# Patient Record
Sex: Male | Born: 1985 | Race: Black or African American | Hispanic: No | Smoking: Current every day smoker
Health system: Southern US, Community
[De-identification: ages and names within clinical notes are randomized; demographics above are authoritative.]

## PROBLEM LIST (undated history)

## (undated) DIAGNOSIS — E739 Lactose intolerance, unspecified: Secondary | ICD-10-CM

## (undated) DIAGNOSIS — K259 Gastric ulcer, unspecified as acute or chronic, without hemorrhage or perforation: Secondary | ICD-10-CM

---
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
## 2006-02-08 ENCOUNTER — Emergency Department: Payer: Self-pay | Admitting: Emergency Medicine

## 2006-02-11 ENCOUNTER — Emergency Department: Payer: Self-pay | Admitting: Emergency Medicine

## 2006-07-19 ENCOUNTER — Emergency Department: Payer: Self-pay | Admitting: Emergency Medicine

## 2006-09-29 ENCOUNTER — Emergency Department: Payer: Self-pay | Admitting: Emergency Medicine

## 2007-02-02 ENCOUNTER — Emergency Department: Payer: Self-pay | Admitting: Emergency Medicine

## 2008-02-28 ENCOUNTER — Emergency Department: Payer: Self-pay | Admitting: Internal Medicine

## 2008-06-20 ENCOUNTER — Emergency Department: Payer: Self-pay | Admitting: Unknown Physician Specialty

## 2010-01-01 ENCOUNTER — Emergency Department: Payer: Self-pay | Admitting: Emergency Medicine

## 2010-03-08 ENCOUNTER — Emergency Department: Payer: Self-pay | Admitting: Internal Medicine

## 2011-10-11 ENCOUNTER — Emergency Department: Payer: Self-pay | Admitting: *Deleted

## 2011-10-13 ENCOUNTER — Emergency Department: Payer: Self-pay | Admitting: *Deleted

## 2011-10-15 ENCOUNTER — Emergency Department: Payer: Self-pay | Admitting: Emergency Medicine

## 2011-10-24 LAB — WOUND CULTURE

## 2012-08-19 ENCOUNTER — Emergency Department: Payer: Self-pay | Admitting: Emergency Medicine

## 2012-08-19 LAB — CBC
HCT: 45.1 % (ref 40.0–52.0)
HGB: 15.3 g/dL (ref 13.0–18.0)
MCV: 84 fL (ref 80–100)
Platelet: 208 10*3/uL (ref 150–440)
RBC: 5.35 10*6/uL (ref 4.40–5.90)
WBC: 6.9 10*3/uL (ref 3.8–10.6)

## 2012-08-19 LAB — URINALYSIS, COMPLETE
Bacteria: NONE SEEN
Glucose,UR: NEGATIVE mg/dL (ref 0–75)
Ketone: NEGATIVE
Leukocyte Esterase: NEGATIVE
Nitrite: NEGATIVE
Squamous Epithelial: NONE SEEN
WBC UR: 1 /HPF (ref 0–5)

## 2012-08-19 LAB — COMPREHENSIVE METABOLIC PANEL
Alkaline Phosphatase: 64 U/L (ref 50–136)
Anion Gap: 7 (ref 7–16)
BUN: 12 mg/dL (ref 7–18)
Bilirubin,Total: 0.4 mg/dL (ref 0.2–1.0)
Chloride: 102 mmol/L (ref 98–107)
Co2: 27 mmol/L (ref 21–32)
SGOT(AST): 26 U/L (ref 15–37)
SGPT (ALT): 50 U/L (ref 12–78)
Sodium: 136 mmol/L (ref 136–145)
Total Protein: 8.6 g/dL — ABNORMAL HIGH (ref 6.4–8.2)

## 2013-03-06 ENCOUNTER — Emergency Department: Payer: Self-pay | Admitting: Emergency Medicine

## 2013-05-22 ENCOUNTER — Emergency Department: Payer: Self-pay | Admitting: Emergency Medicine

## 2013-05-28 ENCOUNTER — Emergency Department: Payer: Self-pay | Admitting: Emergency Medicine

## 2013-07-03 ENCOUNTER — Emergency Department: Payer: Self-pay | Admitting: Internal Medicine

## 2013-08-05 ENCOUNTER — Emergency Department: Payer: Self-pay | Admitting: Emergency Medicine

## 2013-08-05 LAB — CBC WITH DIFFERENTIAL/PLATELET
BASOS ABS: 0.1 10*3/uL (ref 0.0–0.1)
Basophil %: 0.9 %
EOS ABS: 0.1 10*3/uL (ref 0.0–0.7)
EOS PCT: 1 %
HCT: 41.6 % (ref 40.0–52.0)
HGB: 13.2 g/dL (ref 13.0–18.0)
LYMPHS PCT: 38.2 %
Lymphocyte #: 2.2 10*3/uL (ref 1.0–3.6)
MCH: 27.1 pg (ref 26.0–34.0)
MCHC: 31.7 g/dL — ABNORMAL LOW (ref 32.0–36.0)
MCV: 86 fL (ref 80–100)
Monocyte #: 0.3 x10 3/mm (ref 0.2–1.0)
Monocyte %: 5.5 %
NEUTROS ABS: 3.1 10*3/uL (ref 1.4–6.5)
NEUTROS PCT: 54.4 %
Platelet: 177 10*3/uL (ref 150–440)
RBC: 4.86 10*6/uL (ref 4.40–5.90)
RDW: 14.6 % — AB (ref 11.5–14.5)
WBC: 5.7 10*3/uL (ref 3.8–10.6)

## 2013-08-05 LAB — COMPREHENSIVE METABOLIC PANEL
ALBUMIN: 3.6 g/dL (ref 3.4–5.0)
Alkaline Phosphatase: 50 U/L
Anion Gap: 3 — ABNORMAL LOW (ref 7–16)
BUN: 8 mg/dL (ref 7–18)
Bilirubin,Total: 0.3 mg/dL (ref 0.2–1.0)
CALCIUM: 8.6 mg/dL (ref 8.5–10.1)
CO2: 26 mmol/L (ref 21–32)
Chloride: 110 mmol/L — ABNORMAL HIGH (ref 98–107)
Creatinine: 0.89 mg/dL (ref 0.60–1.30)
EGFR (African American): >60
EGFR (Non-African Amer.): >60
Glucose: 79 mg/dL (ref 65–99)
OSMOLALITY: 275 (ref 275–301)
Potassium: 4 mmol/L (ref 3.5–5.1)
SGOT(AST): 62 U/L — ABNORMAL HIGH (ref 15–37)
SGPT (ALT): 46 U/L (ref 12–78)
Sodium: 139 mmol/L (ref 136–145)
TOTAL PROTEIN: 7 g/dL (ref 6.4–8.2)

## 2013-09-03 ENCOUNTER — Emergency Department: Payer: Self-pay | Admitting: Emergency Medicine

## 2013-09-12 ENCOUNTER — Emergency Department: Payer: Self-pay | Admitting: Emergency Medicine

## 2013-10-02 ENCOUNTER — Emergency Department: Payer: Self-pay | Admitting: Emergency Medicine

## 2013-10-02 LAB — COMPREHENSIVE METABOLIC PANEL
ALBUMIN: 4.6 g/dL (ref 3.4–5.0)
ANION GAP: 8 (ref 7–16)
Alkaline Phosphatase: 53 U/L
BUN: 16 mg/dL (ref 7–18)
Bilirubin,Total: 0.4 mg/dL (ref 0.2–1.0)
CALCIUM: 9.9 mg/dL (ref 8.5–10.1)
CHLORIDE: 106 mmol/L (ref 98–107)
Co2: 25 mmol/L (ref 21–32)
Creatinine: 1.14 mg/dL (ref 0.60–1.30)
GLUCOSE: 89 mg/dL (ref 65–99)
Osmolality: 278 (ref 275–301)
POTASSIUM: 3.6 mmol/L (ref 3.5–5.1)
SGOT(AST): 30 U/L (ref 15–37)
SGPT (ALT): 30 U/L (ref 12–78)
Sodium: 139 mmol/L (ref 136–145)
Total Protein: 8.4 g/dL — ABNORMAL HIGH (ref 6.4–8.2)

## 2013-10-02 LAB — CBC WITH DIFFERENTIAL/PLATELET
BASOS ABS: 0 10*3/uL (ref 0.0–0.1)
Basophil %: 0.2 %
EOS PCT: 1.5 %
Eosinophil #: 0.1 10*3/uL (ref 0.0–0.7)
HCT: 44.8 % (ref 40.0–52.0)
HGB: 14.5 g/dL (ref 13.0–18.0)
LYMPHS ABS: 3 10*3/uL (ref 1.0–3.6)
LYMPHS PCT: 44.6 %
MCH: 27.3 pg (ref 26.0–34.0)
MCHC: 32.3 g/dL (ref 32.0–36.0)
MCV: 85 fL (ref 80–100)
Monocyte #: 0.4 x10 3/mm (ref 0.2–1.0)
Monocyte %: 6 %
Neutrophil #: 3.2 10*3/uL (ref 1.4–6.5)
Neutrophil %: 47.7 %
Platelet: 237 10*3/uL (ref 150–440)
RBC: 5.3 10*6/uL (ref 4.40–5.90)
RDW: 14.6 % — ABNORMAL HIGH (ref 11.5–14.5)
WBC: 6.7 10*3/uL (ref 3.8–10.6)

## 2013-10-02 LAB — LIPASE, BLOOD: Lipase: 110 U/L (ref 73–393)

## 2013-11-06 ENCOUNTER — Emergency Department: Payer: Self-pay | Admitting: Emergency Medicine

## 2013-11-07 ENCOUNTER — Emergency Department: Payer: Self-pay | Admitting: Emergency Medicine

## 2013-11-11 ENCOUNTER — Emergency Department: Payer: Self-pay | Admitting: Emergency Medicine

## 2013-12-17 ENCOUNTER — Emergency Department: Payer: Self-pay | Admitting: Emergency Medicine

## 2014-03-07 ENCOUNTER — Emergency Department: Payer: Self-pay | Admitting: Student

## 2014-03-07 LAB — URINALYSIS, COMPLETE
BACTERIA: NONE SEEN
Bilirubin,UR: NEGATIVE
Blood: NEGATIVE
Glucose,UR: NEGATIVE mg/dL (ref 0–75)
Ketone: NEGATIVE
Leukocyte Esterase: NEGATIVE
NITRITE: NEGATIVE
PROTEIN: NEGATIVE
Ph: 5 (ref 4.5–8.0)
RBC,UR: NONE SEEN /HPF (ref 0–5)
SQUAMOUS EPITHELIAL: NONE SEEN
Specific Gravity: 1.023 (ref 1.003–1.030)
WBC UR: NONE SEEN /HPF (ref 0–5)

## 2014-03-07 LAB — CBC WITH DIFFERENTIAL/PLATELET
Basophil #: 0.1 10*3/uL (ref 0.0–0.1)
Basophil %: 0.5 %
Eosinophil #: 0 10*3/uL (ref 0.0–0.7)
Eosinophil %: 0.2 %
HCT: 44.8 % (ref 40.0–52.0)
HGB: 14 g/dL (ref 13.0–18.0)
LYMPHS ABS: 1.1 10*3/uL (ref 1.0–3.6)
Lymphocyte %: 9 %
MCH: 27.4 pg (ref 26.0–34.0)
MCHC: 31.2 g/dL — ABNORMAL LOW (ref 32.0–36.0)
MCV: 88 fL (ref 80–100)
MONO ABS: 0.3 x10 3/mm (ref 0.2–1.0)
Monocyte %: 2.6 %
Neutrophil #: 10.4 10*3/uL — ABNORMAL HIGH (ref 1.4–6.5)
Neutrophil %: 87.7 %
Platelet: 207 10*3/uL (ref 150–440)
RBC: 5.11 10*6/uL (ref 4.40–5.90)
RDW: 14.1 % (ref 11.5–14.5)
WBC: 11.9 10*3/uL — ABNORMAL HIGH (ref 3.8–10.6)

## 2014-03-07 LAB — COMPREHENSIVE METABOLIC PANEL
Albumin: 4 g/dL (ref 3.4–5.0)
Alkaline Phosphatase: 55 U/L
Anion Gap: 11 (ref 7–16)
BILIRUBIN TOTAL: 0.4 mg/dL (ref 0.2–1.0)
BUN: 9 mg/dL (ref 7–18)
CALCIUM: 8.7 mg/dL (ref 8.5–10.1)
CHLORIDE: 106 mmol/L (ref 98–107)
Co2: 23 mmol/L (ref 21–32)
Creatinine: 0.85 mg/dL (ref 0.60–1.30)
EGFR (African American): >60
EGFR (Non-African Amer.): >60
GLUCOSE: 95 mg/dL (ref 65–99)
OSMOLALITY: 278 (ref 275–301)
Potassium: 3.7 mmol/L (ref 3.5–5.1)
SGOT(AST): 29 U/L (ref 15–37)
SGPT (ALT): 35 U/L
Sodium: 140 mmol/L (ref 136–145)
Total Protein: 7.6 g/dL (ref 6.4–8.2)

## 2014-03-07 LAB — LIPASE, BLOOD: Lipase: 77 U/L (ref 73–393)

## 2014-03-23 ENCOUNTER — Emergency Department: Payer: Self-pay | Admitting: Emergency Medicine

## 2014-03-23 LAB — BASIC METABOLIC PANEL
ANION GAP: 5 — AB (ref 7–16)
BUN: 10 mg/dL (ref 7–18)
Calcium, Total: 8.9 mg/dL (ref 8.5–10.1)
Chloride: 107 mmol/L (ref 98–107)
Co2: 27 mmol/L (ref 21–32)
Creatinine: 1.02 mg/dL (ref 0.60–1.30)
EGFR (African American): >60
Glucose: 90 mg/dL (ref 65–99)
Osmolality: 276 (ref 275–301)
POTASSIUM: 3.7 mmol/L (ref 3.5–5.1)
SODIUM: 139 mmol/L (ref 136–145)

## 2014-03-23 LAB — CBC
HCT: 42.6 % (ref 40.0–52.0)
HGB: 14.2 g/dL (ref 13.0–18.0)
MCH: 28.8 pg (ref 26.0–34.0)
MCHC: 33.3 g/dL (ref 32.0–36.0)
MCV: 87 fL (ref 80–100)
PLATELETS: 211 10*3/uL (ref 150–440)
RBC: 4.93 10*6/uL (ref 4.40–5.90)
RDW: 14 % (ref 11.5–14.5)
WBC: 5.2 10*3/uL (ref 3.8–10.6)

## 2014-03-23 LAB — PRO B NATRIURETIC PEPTIDE: B-Type Natriuretic Peptide: 12 pg/mL (ref 0–125)

## 2014-03-23 LAB — TROPONIN I

## 2014-06-19 ENCOUNTER — Emergency Department: Payer: Self-pay | Admitting: Emergency Medicine

## 2014-06-19 LAB — CBC WITH DIFFERENTIAL/PLATELET
BASOS PCT: 2.1 %
Basophil #: 0.2 10*3/uL — ABNORMAL HIGH (ref 0.0–0.1)
EOS ABS: 0.1 10*3/uL (ref 0.0–0.7)
Eosinophil %: 1.7 %
HCT: 51.8 % (ref 40.0–52.0)
HGB: 16.9 g/dL (ref 13.0–18.0)
LYMPHS PCT: 37 %
Lymphocyte #: 2.8 10*3/uL (ref 1.0–3.6)
MCH: 27.9 pg (ref 26.0–34.0)
MCHC: 32.6 g/dL (ref 32.0–36.0)
MCV: 86 fL (ref 80–100)
MONOS PCT: 4.4 %
Monocyte #: 0.3 x10 3/mm (ref 0.2–1.0)
NEUTROS ABS: 4.2 10*3/uL (ref 1.4–6.5)
Neutrophil %: 54.8 %
Platelet: 189 10*3/uL (ref 150–440)
RBC: 6.06 10*6/uL — AB (ref 4.40–5.90)
RDW: 14 % (ref 11.5–14.5)
WBC: 7.7 10*3/uL (ref 3.8–10.6)

## 2014-06-19 LAB — BASIC METABOLIC PANEL
Anion Gap: 6 — ABNORMAL LOW (ref 7–16)
BUN: 10 mg/dL (ref 7–18)
Calcium, Total: 8.9 mg/dL (ref 8.5–10.1)
Chloride: 109 mmol/L — ABNORMAL HIGH (ref 98–107)
Co2: 26 mmol/L (ref 21–32)
Creatinine: 1.04 mg/dL (ref 0.60–1.30)
EGFR (African American): >60
GLUCOSE: 89 mg/dL (ref 65–99)
OSMOLALITY: 280 (ref 275–301)
Potassium: 3.9 mmol/L (ref 3.5–5.1)
Sodium: 141 mmol/L (ref 136–145)

## 2014-09-07 ENCOUNTER — Emergency Department
Admit: 2014-09-07 | Disposition: A | Discharge status: Home / Self Care | Payer: Self-pay | Admitting: Emergency Medicine

## 2014-09-07 LAB — CBC
HCT: 43.1 % (ref 40.0–52.0)
HGB: 13.8 g/dL (ref 13.0–18.0)
MCH: 27.7 pg (ref 26.0–34.0)
MCHC: 32.1 g/dL (ref 32.0–36.0)
MCV: 86 fL (ref 80–100)
Platelet: 200 10*3/uL (ref 150–440)
RBC: 5 10*6/uL (ref 4.40–5.90)
RDW: 13.8 % (ref 11.5–14.5)
WBC: 6.7 10*3/uL (ref 3.8–10.6)

## 2014-09-07 LAB — COMPREHENSIVE METABOLIC PANEL
ANION GAP: 5 — AB (ref 7–16)
Albumin: 3.9 g/dL
Alkaline Phosphatase: 50 U/L
BUN: 7 mg/dL
Bilirubin,Total: <0.1 mg/dL — ABNORMAL LOW
CO2: 26 mmol/L
Calcium, Total: 8.7 mg/dL — ABNORMAL LOW
Chloride: 109 mmol/L
Creatinine: 1.01 mg/dL
Glucose: 92 mg/dL
Potassium: 3.9 mmol/L
SGOT(AST): 22 U/L
SGPT (ALT): 32 U/L
SODIUM: 140 mmol/L
Total Protein: 6.3 g/dL — ABNORMAL LOW

## 2014-09-07 LAB — URINALYSIS, COMPLETE
Bacteria: NONE SEEN
Bilirubin,UR: NEGATIVE
Blood: NEGATIVE
Glucose,UR: NEGATIVE mg/dL (ref 0–75)
Ketone: NEGATIVE
LEUKOCYTE ESTERASE: NEGATIVE
NITRITE: NEGATIVE
PROTEIN: NEGATIVE
Ph: 5 (ref 4.5–8.0)
Specific Gravity: 1.016 (ref 1.003–1.030)
Squamous Epithelial: NONE SEEN

## 2014-09-07 LAB — LIPASE, BLOOD: Lipase: 29 U/L

## 2014-10-13 ENCOUNTER — Emergency Department
Admission: EM | Admit: 2014-10-13 | Discharge: 2014-10-13 | Disposition: A | Discharge status: Home / Self Care | Payer: Medicaid Other | Attending: Emergency Medicine | Admitting: Emergency Medicine

## 2014-10-13 ENCOUNTER — Emergency Department: Payer: Self-pay

## 2014-10-13 DIAGNOSIS — S62609A Fracture of unspecified phalanx of unspecified finger, initial encounter for closed fracture: Secondary | ICD-10-CM

## 2014-10-13 DIAGNOSIS — Y9289 Other specified places as the place of occurrence of the external cause: Secondary | ICD-10-CM | POA: Insufficient documentation

## 2014-10-13 DIAGNOSIS — Z720 Tobacco use: Secondary | ICD-10-CM | POA: Insufficient documentation

## 2014-10-13 DIAGNOSIS — W231XXA Caught, crushed, jammed, or pinched between stationary objects, initial encounter: Secondary | ICD-10-CM | POA: Insufficient documentation

## 2014-10-13 DIAGNOSIS — Y998 Other external cause status: Secondary | ICD-10-CM | POA: Insufficient documentation

## 2014-10-13 DIAGNOSIS — S62660A Nondisplaced fracture of distal phalanx of right index finger, initial encounter for closed fracture: Secondary | ICD-10-CM | POA: Insufficient documentation

## 2014-10-13 DIAGNOSIS — Y9389 Activity, other specified: Secondary | ICD-10-CM | POA: Insufficient documentation

## 2014-10-13 HISTORY — DX: Lactose intolerance, unspecified: E73.9

## 2014-10-13 HISTORY — DX: Gastric ulcer, unspecified as acute or chronic, without hemorrhage or perforation: K25.9

## 2014-10-13 MED ORDER — TRAMADOL HCL 50 MG PO TABS
50.0000 mg | ORAL_TABLET | Freq: Once | ORAL | Status: AC
Start: 2014-10-13 — End: 2014-10-13
  Administered 2014-10-13: 50 mg via ORAL

## 2014-10-13 MED ORDER — TRAMADOL HCL 50 MG PO TABS: 50.0000 mg | ORAL_TABLET | Freq: Three times a day (TID) | ORAL | Status: DC | PRN

## 2014-10-13 MED ORDER — TRAMADOL HCL 50 MG PO TABS
ORAL_TABLET | ORAL | Status: AC
  Administered 2014-10-13: 50 mg via ORAL
  Filled 2014-10-13: qty 1

## 2014-10-13 MED ORDER — IBUPROFEN 800 MG PO TABS: 800.0000 mg | ORAL_TABLET | Freq: Three times a day (TID) | ORAL | Status: DC | PRN

## 2014-10-13 NOTE — ED Provider Notes (Signed)
[facility] Emergency Department Provider Note ____________________________________________  Time seen: Approximately 8:30 PM  I have reviewed the triage vital signs and the nursing notes.   HISTORY  Chief Complaint Finger Injury   HPI Wesley Hart is a 29 y.o. male presents to the ER for complaints of right finger pain. Patient states that this morning on the way to work he was getting out of a car and accidentally shut his right second finger in the car door. Patient states he has had pain all day but tried to continue to work. Patient states presents now because the pain has continued. Denies other fall or injury. Denies pain to any other fingers hands or other extremities. Denies other pain.   Patient states that the pain is 7 out of 10 throbbing pain. Denies numbness or tingling. Reports swelling since this morning. Reports works for [employer] and had difficulty working today due to pain.  Past Medical History  Diagnosis Date  . Stomach ulcer   . Lactose intolerance     There are no active problems to display for this patient.   Past Surgical History  Procedure Laterality Date  . Fracture surgery      No current outpatient prescriptions on file.  Allergies Lactose intolerance (gi)  No family history on file.  Social History History  Substance Use Topics  . Smoking status: Current Every Day Smoker -- 0.50 packs/day    Types: Cigarettes  . Smokeless tobacco: Never Used  . Alcohol Use: No    Review of Systems Constitutional: No fever/chills Eyes: No visual changes. ENT: No sore throat. Cardiovascular: Denies chest pain. Respiratory: Denies shortness of breath. Gastrointestinal: No abdominal pain.  No nausea, no vomiting.  No diarrhea.  No constipation. Genitourinary: Negative for dysuria. Musculoskeletal: Negative for back pain. Positive for right second finger pain as above. Skin: Negative for rash. Neurological: Negative  for headaches, focal weakness or numbness.  10-point ROS otherwise negative.  ____________________________________________   PHYSICAL EXAM:  VITAL SIGNS: ED Triage Vitals  Enc Vitals Group     BP 10/13/14 1814 159/88 mmHg     Pulse Rate 10/13/14 1814 63     Resp 10/13/14 1814 18     Temp 10/13/14 1814 97.8 F (36.6 C)     Temp Source 10/13/14 1814 Oral     SpO2 10/13/14 1814 98 %     Weight 10/13/14 1814 230 lb (104.327 kg)     Height 10/13/14 1814 5\' 9"  (1.753 m)     Head Cir --      Peak Flow --      Pain Score 10/13/14 1815 9     Pain Loc --      Pain Edu? --      Excl. in [place]? --     Constitutional: Alert and oriented. Well appearing and in no acute distress. Head: Atraumatic. Nose: No congestion/rhinnorhea. Mouth/Throat: Mucous membranes are moist. Neck: No stridor.  No cervical spine tenderness to palpation. Cardiovascular: Normal rate, regular rhythm. Grossly normal heart sounds.  Good peripheral circulation. Respiratory: Normal respiratory effort.  No retractions. Lungs CTAB. Gastrointestinal: Soft and nontender. No distention.  Musculoskeletal: No lower extremity tenderness nor edema.  No joint effusions. Right second distal phalanx moderate tender to palpation. Superficial abrasions. Mild swelling and ecchymosis. Sensation intact. Cap refill less than 2 seconds. Pain with dip movement. Hand otherwise nontender. Distal radial pulses equal bilaterally. Neurologic:  Normal speech and language. No gross focal neurologic deficits are  appreciated. Speech is normal. No gait instability. Skin:  Skin is warm, dry and intact. No rash noted. Except superficial abrasions to right second finger. Psychiatric: Mood and affect are normal. Speech and behavior are normal.   RADIOLOGY RIGHT INDEX FINGER 2+V  COMPARISON: Hand radiographs 03/06/2013.  FINDINGS: Comminuted fracture of the distal tuft of the index finger demonstrates no definite intra-articular extension or  significant displacement. There is no dislocation. Mild soft tissue swelling is noted.  IMPRESSION: Nondisplaced fracture of the distal second phalanx.   Electronically Signed By: Richardean Sale M.D. On: 10/13/2014 19:07  ____________________________________________   PROCEDURES  Procedure(s) performed: SPLINT APPLICATION Date/Time: 1:22 PM Authorized by: Marylene Land Consent: Verbal consent obtained. Risks and benefits: risks, benefits and alternatives were discussed Consent given by: patient Splint applied by:ed  technician Location details: right second finger  Splint type: aluminum foam finger splint and 2/3 fingers buddy taped Post-procedure: The splinted body part was neurovascularly unchanged following the procedure. Patient tolerance: Patient tolerated the procedure well with no immediate complications.  ____________________________________________   INITIAL IMPRESSION / ASSESSMENT AND PLAN / ED COURSE  Pertinent labs & imaging results that were available during my care of the patient were reviewed by me and considered in my medical decision making (see chart for details).  Very well-appearing patient. no acute distress. Presents with right second finger pain after shutting in car door. X-ray positive for nondisplaced fracture of the distal second phalanx. Finger buddy taped and placed in splint. Patient to follow-up with orthopedics this week. Splint ice and elevate. Discussed importance to follow-up and discussed return parameters. Patient agreed to plan. ____________________________________________   FINAL CLINICAL IMPRESSION(S) / ED DIAGNOSES  Final diagnoses:  Finger fracture, closed, initial encounter      Marylene Land, NP 10/13/14 2135  Harvest Dark, MD 10/13/14 2356

## 2014-10-13 NOTE — ED Notes (Signed)
Pt states he slammed his right pointer finger in the car door this morning and has been having pain and swelling since

## 2014-10-13 NOTE — Discharge Instructions (Signed)
Take medication as prescribed. Keep finger and splint and keep taped together. Follow-up with orthopedics this week. See above to call to schedule. Apply ice and elevate.  Return to the ER for new or worsening concerns.  Finger Fracture (Phalangeal) A broken bone of the finger (phalangealfracture) is a common injury for athletes. A single injury (trauma) is likely to fracture multiple bones on the same or different fingers. SYMPTOMS   Severe pain, at the time of injury.  Pain, tenderness, swelling, and later bruising of the finger and then the hand.  Visible deformity, if the fracture is complete and the bone fragments separate enough to distort the normal shape.  Numbness or coldness from swelling in the finger, causing pressure on blood vessels or nerves (uncommon). CAUSES  Direct or indirect injury (trauma) to the finger.  RISK INCREASES WITH:   Contact sports (football, rugby) or other sports where injury to the hand is likely (soccer, baseball, basketball).  Sports that require hitting (boxing, martial arts).  History of bone or joint disease, such as osteoporosis, or previous bone restraint.  Poor hand strength and flexibility. PREVENTION   For contact sports, wear appropriate and properly fitted protective equipment for the hand.  Learn and use proper technique when hitting, punching, or landing after a fall.  If you had a previous finger injury or hand restraint, use tape or padding to protect the finger when playing sports where finger injury is likely. PROGNOSIS  With proper treatment and normal alignment of the bones, healing can usually be expected in 4 to 6 weeks. Sometimes, surgery is needed.  RELATED COMPLICATIONS   Fracture does not heal (nonunion).  Bone heals in wrong position (malunion).  Chronic pain, stiffness, or swelling of the hand.  Excessive bleeding, causing pressure on nerves and blood vessels.  Unstable or arthritic joint, following repeated  injury or delayed treatment.  Hindrance of normal growth in children.  Infection in skin broken over the fracture (open fracture) or at the incision or pin sites from surgery.  Shortening of injured bones.  Bony bumps or loss of shape of the fingers.  Arthritic or stiff finger joint, if the fracture reaches the joint. TREATMENT  If the bones are properly aligned, treatment involves ice and medicine to reduce pain and inflammation. Then, the finger is restrained for 4 or more weeks, to allow for healing. If the fracture is out of alignment (displaced), involves more than one bone, or involves a joint, surgery is usually advised. Surgery often involves placing removable pins, screws, and sometimes plates, to hold the bones in proper alignment. After restraint (with or without surgery), stretching and strengthening exercises are needed. Exercises may be completed at home or with a therapist. For certain sports, wearing a splint or having the finger taped during future activity is advised.  MEDICATION   If pain medicine is needed, nonsteroidal anti-inflammatory medicines (aspirin and ibuprofen), or other minor pain relievers (acetaminophen), are often advised.  Do not take pain medicine for 7 days before surgery.  Prescription pain relievers are usually prescribed only after surgery. Use only as directed and only as much as you need. COLD THERAPY   Cold treatment (icing) relieves pain and reduces inflammation. Cold treatment should be applied for 10 to 15 minutes every 2 to 3 hours, and immediately after activity that aggravates your symptoms. Use ice packs or an ice massage. SEEK MEDICAL CARE IF:   Pain, tenderness, or swelling gets worse, despite treatment.  You experience pain, numbness, or  coldness in the hand.  Blue, gray, or dark color appears in the fingernails.  Any of the following occur after surgery: fever, increased pain, swelling, redness, drainage of fluids, or bleeding in  the affected area.  New, unexplained symptoms develop. (Drugs used in treatment may produce side effects.) Document Released: 05/09/2005 Document Revised: 08/01/2011 Document Reviewed: 08/21/2008 [facility] Patient Information 2015 [place], [place]. This information is not intended to replace advice given to you by your health care provider. Make sure you discuss any questions you have with your health care provider.

## 2014-12-06 ENCOUNTER — Emergency Department
Admission: EM | Admit: 2014-12-06 | Discharge: 2014-12-06 | Disposition: A | Discharge status: Home / Self Care | Payer: Medicaid Other | Attending: Student | Admitting: Student

## 2014-12-06 ENCOUNTER — Encounter: Payer: Self-pay | Admitting: Emergency Medicine

## 2014-12-06 DIAGNOSIS — L0501 Pilonidal cyst with abscess: Secondary | ICD-10-CM | POA: Insufficient documentation

## 2014-12-06 DIAGNOSIS — Z720 Tobacco use: Secondary | ICD-10-CM | POA: Insufficient documentation

## 2014-12-06 MED ORDER — IBUPROFEN 800 MG PO TABS: 800.0000 mg | ORAL_TABLET | Freq: Three times a day (TID) | ORAL | Status: DC | PRN

## 2014-12-06 MED ORDER — SULFAMETHOXAZOLE-TRIMETHOPRIM 400-80 MG PO TABS: 1.0000 | ORAL_TABLET | Freq: Two times a day (BID) | ORAL | Status: DC

## 2014-12-06 MED ORDER — LIDOCAINE-EPINEPHRINE-TETRACAINE (LET) SOLUTION
3.0000 mL | Freq: Once | NASAL | Status: AC
  Administered 2014-12-06: 3 mL via TOPICAL
  Filled 2014-12-06: qty 3

## 2014-12-06 MED ORDER — OXYCODONE-ACETAMINOPHEN 5-325 MG PO TABS: 1.0000 | ORAL_TABLET | Freq: Three times a day (TID) | ORAL | Status: DC | PRN

## 2014-12-06 MED ORDER — OXYCODONE-ACETAMINOPHEN 5-325 MG PO TABS
1.0000 | ORAL_TABLET | Freq: Once | ORAL | Status: AC
Start: 2014-12-06 — End: 2014-12-06
  Administered 2014-12-06: 1 via ORAL
  Filled 2014-12-06: qty 1

## 2014-12-06 NOTE — Discharge Instructions (Signed)
Take medication as prescribed. Keep area clean. Do not remove packing. Apply warm compresses 4-5 times a day to help promote drainage. Change dressing as needed. Return to the ER in 3 days for packing removal and wound check. Return to the ER for new or worsening concerns.  Pilonidal Cyst A pilonidal cyst occurs when hairs get trapped (ingrown) beneath the skin in the crease between the buttocks over your sacrum (the bone under that crease). Pilonidal cysts are most common in young men with a lot of body hair. When the cyst is ruptured (breaks) or leaking, fluid from the cyst may cause burning and itching. If the cyst becomes infected, it causes a painful swelling filled with pus (abscess). The pus and trapped hairs need to be removed (often by lancing) so that the infection can heal. However, recurrence is common and an operation may be needed to remove the cyst. HOME CARE INSTRUCTIONS   If the cyst was NOT INFECTED:  Keep the area clean and dry. Bathe or shower daily. Wash the area well with a germ-killing soap. Warm tub baths may help prevent infection and help with drainage. Dry the area well with a towel.  Avoid tight clothing to keep area as moisture free as possible.  Keep area between buttocks as free of hair as possible. A depilatory may be used.  If the cyst WAS INFECTED and needed to be drained:  Your caregiver packed the wound with gauze to keep the wound open. This allows the wound to heal from the inside outwards and continue draining.  Return for a wound check in 1 day or as suggested.  If you take tub baths or showers, repack the wound with gauze following them. Sponge baths (at the sink) are a good alternative.  If an antibiotic was ordered to fight the infection, take as directed.  Only take over-the-counter or prescription medicines for pain, discomfort, or fever as directed by your caregiver.  After the drain is removed, use sitz baths for 20 minutes 4 times per day.  Clean the wound gently with mild unscented soap, pat dry, and then apply a dry dressing. SEEK MEDICAL CARE IF:   You have increased pain, swelling, redness, drainage, or bleeding from the area.  You have a fever.  You have muscles aches, dizziness, or a general ill feeling. Document Released: 05/06/2000 Document Revised: 08/01/2011 Document Reviewed: 07/04/2008 [facility] Patient Information 2015 [place], [place]. This information is not intended to replace advice given to you by your health care provider. Make sure you discuss any questions you have with your health care provider.  Abscess An abscess is an infected area that contains a collection of pus and debris.It can occur in almost any part of the body. An abscess is also known as a furuncle or boil. CAUSES  An abscess occurs when tissue gets infected. This can occur from blockage of oil or sweat glands, infection of hair follicles, or a minor injury to the skin. As the body tries to fight the infection, pus collects in the area and creates pressure under the skin. This pressure causes pain. People with weakened immune systems have difficulty fighting infections and get certain abscesses more often.  SYMPTOMS Usually an abscess develops on the skin and becomes a painful mass that is red, warm, and tender. If the abscess forms under the skin, you may feel a moveable soft area under the skin. Some abscesses break open (rupture) on their own, but most will continue to get worse without  care. The infection can spread deeper into the body and eventually into the bloodstream, causing you to feel ill.  DIAGNOSIS  Your caregiver will take your medical history and perform a physical exam. A sample of fluid may also be taken from the abscess to determine what is causing your infection. TREATMENT  Your caregiver may prescribe antibiotic medicines to fight the infection. However, taking antibiotics alone usually does not cure an abscess. Your caregiver  may need to make a small cut (incision) in the abscess to drain the pus. In some cases, gauze is packed into the abscess to reduce pain and to continue draining the area. HOME CARE INSTRUCTIONS   Only take over-the-counter or prescription medicines for pain, discomfort, or fever as directed by your caregiver.  If you were prescribed antibiotics, take them as directed. Finish them even if you start to feel better.  If gauze is used, follow your caregiver's directions for changing the gauze.  To avoid spreading the infection:  Keep your draining abscess covered with a bandage.  Wash your hands well.  Do not share personal care items, towels, or whirlpools with others.  Avoid skin contact with others.  Keep your skin and clothes clean around the abscess.  Keep all follow-up appointments as directed by your caregiver. SEEK MEDICAL CARE IF:   You have increased pain, swelling, redness, fluid drainage, or bleeding.  You have muscle aches, chills, or a general ill feeling.  You have a fever. MAKE SURE YOU:   Understand these instructions.  Will watch your condition.  Will get help right away if you are not doing well or get worse. Document Released: 02/16/2005 Document Revised: 11/08/2011 Document Reviewed: 07/22/2011 [facility] Patient Information 2015 [place], [place]. This information is not intended to replace advice given to you by your health care provider. Make sure you discuss any questions you have with your health care provider.

## 2014-12-06 NOTE — ED Notes (Signed)
Patient to ED with c/o increasing tail bone pain over the last 3 days, reports having broken it earlier in year.

## 2014-12-06 NOTE — ED Provider Notes (Signed)
[facility] Emergency Department Provider Note  ____________________________________________  Time seen: Approximately 2:19 PM  I have reviewed the triage vital signs and the nursing notes.   HISTORY  Chief Complaint Tailbone Pain   HPI Wesley Hart is a 29 y.o. male presents to the ER for pain at telephone area 3-4 days. Reports gradual onset. Patient reports that he has broken his tailbone in the past but this pain feels different. Patient denies any other pain or injury. Reports that area is swelling intermittently. Denies fever, rectal pain, dysuria or other complaints. Reports continues to eat and drink well. Reports pain is currently 7 out of 10 and worse with sitting on area.   Past Medical History  Diagnosis Date  . Stomach ulcer   . Lactose intolerance     There are no active problems to display for this patient.   Past Surgical History  Procedure Laterality Date  . Fracture surgery      Current Outpatient Rx  Name  Route  Sig  Dispense  Refill  .           Marland Kitchen             Allergies Lactose intolerance (gi) and Prednisone  History reviewed. No pertinent family history.  Social History History  Substance Use Topics  . Smoking status: Current Every Day Smoker -- 0.50 packs/day    Types: Cigarettes  . Smokeless tobacco: Never Used  . Alcohol Use: No    Review of Systems Constitutional: No fever/chills Eyes: No visual changes. ENT: No sore throat. Cardiovascular: Denies chest pain. Respiratory: Denies shortness of breath. Gastrointestinal: No abdominal pain.  No nausea, no vomiting.  No diarrhea.  No constipation. Genitourinary: Negative for dysuria. Musculoskeletal: Negative for back pain. Skin: Negative for rash. Swelling at tailbone.  Neurological: Negative for headaches, focal weakness or numbness.  10-point ROS otherwise negative.  ____________________________________________   PHYSICAL EXAM:  VITAL SIGNS: ED  Triage Vitals  Enc Vitals Group     BP 12/06/14 1225 123/80 mmHg     Pulse Rate 12/06/14 1225 64     Resp 12/06/14 1225 20     Temp 12/06/14 1225 97.7 F (36.5 C)     Temp Source 12/06/14 1225 Oral     SpO2 12/06/14 1225 97 %     Weight 12/06/14 1225 235 lb (106.595 kg)     Height 12/06/14 1225 5\' 10"  (1.778 m)     Head Cir --      Peak Flow --      Pain Score 12/06/14 1226 10     Pain Loc --      Pain Edu? --      Excl. in [place]? --     Constitutional: Alert and oriented. Well appearing and in no acute distress. Eyes: Conjunctivae are normal. PERRL. EOMI. Head: Atraumatic. Nose: No congestion/rhinnorhea. Mouth/Throat: Mucous membranes are moist.  Oropharynx non-erythematous. Neck: No stridor.  No cervical spine tenderness to palpation. Hematological/Lymphatic/Immunilogical: No cervical lymphadenopathy. Cardiovascular: Normal rate, regular rhythm. Grossly normal heart sounds.  Good peripheral circulation. Respiratory: Normal respiratory effort.  No retractions. Lungs CTAB. Gastrointestinal: Soft and nontender. No distention. No abdominal bruits. No CVA tenderness. Musculoskeletal: No lower extremity tenderness nor edema.  No joint effusions. Neurologic:  Normal speech and language. No gross focal neurologic deficits are appreciated. No gait instability. Skin:  Skin is warm, dry and intact. No rash noted. Upper buttocks right sided pilonidal abscess, with mild swelling, mod induration and  mod TTP. No drainage.  Psychiatric: Mood and affect are normal. Speech and behavior are normal.  ____________________________________________   LABS (all labs ordered are listed, but only abnormal results are displayed)  Labs Reviewed - No data to display ____________________________________________  PROCEDURES  Procedure(s) performed:  INCISION AND DRAINAGE Performed by: Marylene Land Consent: Verbal consent obtained. Risks and benefits: risks, benefits and alternatives were  discussed Type: abscess  Body area: upper buttocks right sided  Anesthesia: local infiltration  Incision was made with a scalpel.  Local anesthetic: topical LET (patient reports that he is scared of needles and request topical anesthesia)  Complexity: complex Blunt dissection to break up loculations  Drainage: purulent  Drainage amount: large  Packing material: 1/4 in iodoform gauze  Patient tolerance: Patient tolerated the procedure well with no immediate complications.   ____________________________________________   INITIAL IMPRESSION / ASSESSMENT AND PLAN / ED COURSE  Pertinent labs & imaging results that were available during my care of the patient were reviewed by me and considered in my medical decision making (see chart for details).  Very well-appearing patient. Presents the ER for the complaints of swelling to tailbone area 3-4 days. Patient since with pilonidal abscess. Abscess drained in ER with a large amount purulent returned packed with one fourth cause. Patient return in ER in ER for 2-3 days for wound check and packing removal. Discuss follow-up and return parameters. Patient agreed to plan. ____________________________________________   FINAL CLINICAL IMPRESSION(S) / ED DIAGNOSES  Final diagnoses:  Pilonidal abscess      Marylene Land, NP 12/06/14 Melody [place] Gayle, MD 12/06/14 1555

## 2015-01-20 ENCOUNTER — Encounter: Payer: Self-pay | Admitting: Emergency Medicine

## 2015-01-20 ENCOUNTER — Emergency Department
Admission: EM | Admit: 2015-01-20 | Discharge: 2015-01-20 | Disposition: A | Discharge status: Home / Self Care | Payer: Medicaid Other | Attending: Emergency Medicine | Admitting: Emergency Medicine

## 2015-01-20 DIAGNOSIS — L0501 Pilonidal cyst with abscess: Secondary | ICD-10-CM | POA: Insufficient documentation

## 2015-01-20 DIAGNOSIS — Z720 Tobacco use: Secondary | ICD-10-CM | POA: Insufficient documentation

## 2015-01-20 MED ORDER — OXYCODONE-ACETAMINOPHEN 5-325 MG PO TABS: 1.0000 | ORAL_TABLET | ORAL | Status: DC | PRN

## 2015-01-20 MED ORDER — SULFAMETHOXAZOLE-TRIMETHOPRIM 800-160 MG PO TABS: 2.0000 | ORAL_TABLET | Freq: Two times a day (BID) | ORAL | Status: AC

## 2015-01-20 MED ORDER — LIDOCAINE HCL (PF) 1 % IJ SOLN
5.0000 mL | Freq: Once | INTRAMUSCULAR | Status: AC
  Administered 2015-01-20: 5 mL via INTRADERMAL
  Filled 2015-01-20: qty 5

## 2015-01-20 MED ORDER — OXYCODONE-ACETAMINOPHEN 5-325 MG PO TABS
2.0000 | ORAL_TABLET | Freq: Once | ORAL | Status: AC
Start: 2015-01-20 — End: 2015-01-20
  Administered 2015-01-20: 2 via ORAL
  Filled 2015-01-20: qty 2

## 2015-01-20 MED ORDER — SULFAMETHOXAZOLE-TRIMETHOPRIM 800-160 MG PO TABS
2.0000 | ORAL_TABLET | Freq: Once | ORAL | Status: AC
  Administered 2015-01-20: 2 via ORAL
  Filled 2015-01-20: qty 2

## 2015-01-20 NOTE — ED Notes (Signed)
Patient ambulatory to triage with steady gait, without difficulty or distress noted; pt reports abscess to coccyx area; st here few months ago for same

## 2015-01-20 NOTE — ED Provider Notes (Signed)
[facility] Emergency Department Provider Note  ____________________________________________  Time seen: 6:00 AM  I have reviewed the triage vital signs and the nursing notes.   HISTORY  Chief Complaint Abscess     HPI Wesley Hart is a 29 y.o. male presents with abscess in his coccyx time one day. Patient states he was seen for the same here few months ago which required an I&D. Patient states she's had 2 other episodes of the same. Patient denies any fever no nausea or vomiting     Past Medical History  Diagnosis Date  . Stomach ulcer   . Lactose intolerance     There are no active problems to display for this patient.   Past Surgical History  Procedure Laterality Date  . Fracture surgery      Current Outpatient Rx  Name  Route  Sig  Dispense  Refill  . ibuprofen (ADVIL,MOTRIN) 800 MG tablet   Oral   Take 1 tablet (800 mg total) by mouth every 8 (eight) hours as needed for mild pain or moderate pain.   15 tablet   0   . oxyCODONE-acetaminophen (ROXICET) 5-325 MG per tablet   Oral   Take 1 tablet by mouth every 8 (eight) hours as needed for moderate pain or severe pain (Do not drive or operate heavy machinery while taking as can cause drowsiness.).   9 tablet   0   . sulfamethoxazole-trimethoprim (BACTRIM) 400-80 MG per tablet   Oral   Take 1 tablet by mouth 2 (two) times daily.   20 tablet   0   . traMADol (ULTRAM) 50 MG tablet   Oral   Take 1 tablet (50 mg total) by mouth every 8 (eight) hours as needed (Do not drive or operate machinery while taking as can cause drowsiness.).   12 tablet   0     Allergies Lactose intolerance (gi) and Prednisone  No family history on file.  Social History Social History  Substance Use Topics  . Smoking status: Current Every Day Smoker -- 0.50 packs/day    Types: Cigarettes  . Smokeless tobacco: Never Used  . Alcohol Use: No    Review of Systems  Constitutional: Negative for  fever. Eyes: Negative for visual changes. ENT: Negative for sore throat. Cardiovascular: Negative for chest pain. Respiratory: Negative for shortness of breath. Gastrointestinal: Negative for abdominal pain, vomiting and diarrhea. Genitourinary: Negative for dysuria. Musculoskeletal: Negative for back pain. Skin: Negative for rash. Positive for abscess Neurological: Negative for headaches, focal weakness or numbness.   10-point ROS otherwise negative.  ____________________________________________   PHYSICAL EXAM:  VITAL SIGNS: ED Triage Vitals  Enc Vitals Group     BP 01/20/15 0235 138/84 mmHg     Pulse Rate 01/20/15 0235 83     Resp 01/20/15 0235 20     Temp 01/20/15 0235 98.2 F (36.8 C)     Temp Source 01/20/15 0235 Oral     SpO2 01/20/15 0235 98 %     Weight 01/20/15 0235 240 lb (108.863 kg)     Height 01/20/15 0235 5\' 10"  (1.778 m)     Head Cir --      Peak Flow --      Pain Score 01/20/15 0233 10     Pain Loc --      Pain Edu? --      Excl. in [place]? --      Constitutional: Alert and oriented. Well appearing and in no distress.  Eyes: Conjunctivae are normal. PERRL. Normal extraocular movements. ENT   Head: Normocephalic and atraumatic.   Nose: No congestion/rhinnorhea.   Mouth/Throat: Mucous membranes are moist.   Neck: No stridor. Hematological/Lymphatic/Immunilogical: No cervical lymphadenopathy. Cardiovascular: Normal rate, regular rhythm. Normal and symmetric distal pulses are present in all extremities. No murmurs, rubs, or gallops. Respiratory: Normal respiratory effort without tachypnea nor retractions. Breath sounds are clear and equal bilaterally. No wheezes/rales/rhonchi. Gastrointestinal: Soft and nontender. No distention. There is no CVA tenderness. Genitourinary: deferred Musculoskeletal: Nontender with normal range of motion in all extremities. No joint effusions.  No lower extremity tenderness nor edema. Neurologic:  Normal speech and  language. No gross focal neurologic deficits are appreciated. Speech is normal.  Skin:  Tender flocculent area and noted pilonidal region. Psychiatric: Mood and affect are normal. Speech and behavior are normal. Patient exhibits appropriate insight and judgment.    PROCEDURES  Procedure(s) performed: INCISION AND DRAINAGE Performed by: Marjean Donna N Consent: Verbal consent obtained. Risks and benefits: risks, benefits and alternatives were discussed Type: abscess  Body area: pilonidal area   Anesthesia: local infiltration  Incision was made with a scalpel.  Local anesthetic: lidocaine 1%   Anesthetic total: 64ml  Complexity: complex Blunt dissection to break up loculations  Drainage: purulent  Drainage amount: 5 cc of pus   Packing material: 1/4 in iodoform gauze  Patient tolerance: Patient tolerated the procedure well with no immediate complications.      ____________________________________________   INITIAL IMPRESSION / ASSESSMENT AND PLAN / ED COURSE  Pilonidal abscess I&D with 5 cc of purulent drainage. Patient given Percocet for pain and Bactrim will be prescribed the same for home.ging results that were available during my care of the patient were reviewed by me and considered in my medical decision making    ____________________________________________   FINAL CLINICAL IMPRESSION(S) / ED DIAGNOSES  Final diagnoses:  Pilonidal abscess      Gregor Hams, MD 01/20/15 [id number]

## 2015-01-20 NOTE — Discharge Instructions (Signed)
°

## 2015-04-03 ENCOUNTER — Encounter: Payer: Self-pay | Admitting: Emergency Medicine

## 2015-04-03 ENCOUNTER — Emergency Department
Admission: EM | Admit: 2015-04-03 | Discharge: 2015-04-03 | Disposition: A | Discharge status: Home / Self Care | Payer: Medicaid Other | Attending: Emergency Medicine | Admitting: Emergency Medicine

## 2015-04-03 DIAGNOSIS — L0501 Pilonidal cyst with abscess: Secondary | ICD-10-CM | POA: Insufficient documentation

## 2015-04-03 DIAGNOSIS — Z720 Tobacco use: Secondary | ICD-10-CM | POA: Insufficient documentation

## 2015-04-03 MED ORDER — SULFAMETHOXAZOLE-TRIMETHOPRIM 800-160 MG PO TABS
1.0000 | ORAL_TABLET | Freq: Once | ORAL | Status: AC
  Administered 2015-04-03: 1 via ORAL

## 2015-04-03 MED ORDER — LIDOCAINE HCL (PF) 1 % IJ SOLN
INTRAMUSCULAR | Status: AC
  Administered 2015-04-03: 5 mL via INTRADERMAL
  Filled 2015-04-03: qty 5

## 2015-04-03 MED ORDER — SULFAMETHOXAZOLE-TRIMETHOPRIM 800-160 MG PO TABS: 1.0000 | ORAL_TABLET | Freq: Two times a day (BID) | ORAL | Status: DC

## 2015-04-03 MED ORDER — OXYCODONE-ACETAMINOPHEN 5-325 MG PO TABS
1.0000 | ORAL_TABLET | Freq: Once | ORAL | Status: AC
  Administered 2015-04-03: 1 via ORAL
  Filled 2015-04-03: qty 1

## 2015-04-03 MED ORDER — LIDOCAINE HCL (PF) 1 % IJ SOLN
5.0000 mL | Freq: Once | INTRAMUSCULAR | Status: AC
  Administered 2015-04-03: 5 mL via INTRADERMAL

## 2015-04-03 MED ORDER — SULFAMETHOXAZOLE-TRIMETHOPRIM 800-160 MG PO TABS
ORAL_TABLET | ORAL | Status: AC
  Administered 2015-04-03: 1 via ORAL
  Filled 2015-04-03: qty 1

## 2015-04-03 MED ORDER — OXYCODONE-ACETAMINOPHEN 5-325 MG PO TABS: 1.0000 | ORAL_TABLET | ORAL | Status: DC | PRN

## 2015-04-03 NOTE — ED Provider Notes (Signed)
[facility] Emergency Department Provider Note  ____________________________________________  Time seen: 1:30 AM  I have reviewed the triage vital signs and the nursing notes.   HISTORY  Chief Complaint Abscess     HPI Wesley Hart is a 29 y.o. male presents with abscess to sacral region which she noted yesterday. Of note patient has a history of pilonidal abscess that has required incision and drainage in the past with packing. Patient denies any fever no nausea or vomiting current pain score is 10 out of 10.     Past Medical History  Diagnosis Date  . Stomach ulcer   . Lactose intolerance     There are no active problems to display for this patient.   Past Surgical History  Procedure Laterality Date  . Fracture surgery      Current Outpatient Rx  Name  Route  Sig  Dispense  Refill  . ibuprofen (ADVIL,MOTRIN) 800 MG tablet   Oral   Take 1 tablet (800 mg total) by mouth every 8 (eight) hours as needed for mild pain or moderate pain.   15 tablet   0   . oxyCODONE-acetaminophen (PERCOCET/ROXICET) 5-325 MG per tablet   Oral   Take 1 tablet by mouth every 4 (four) hours as needed for severe pain.   12 tablet   0   . oxyCODONE-acetaminophen (ROXICET) 5-325 MG per tablet   Oral   Take 1 tablet by mouth every 8 (eight) hours as needed for moderate pain or severe pain (Do not drive or operate heavy machinery while taking as can cause drowsiness.).   9 tablet   0   . sulfamethoxazole-trimethoprim (BACTRIM) 400-80 MG per tablet   Oral   Take 1 tablet by mouth 2 (two) times daily.   20 tablet   0   . traMADol (ULTRAM) 50 MG tablet   Oral   Take 1 tablet (50 mg total) by mouth every 8 (eight) hours as needed (Do not drive or operate machinery while taking as can cause drowsiness.).   12 tablet   0     Allergies Lactose intolerance (gi) and Prednisone  No family history on file.  Social History Social History  Substance Use  Topics  . Smoking status: Current Every Day Smoker -- 0.50 packs/day    Types: Cigarettes  . Smokeless tobacco: Never Used  . Alcohol Use: No    Review of Systems  Constitutional: Negative for fever. Eyes: Negative for visual changes. ENT: Negative for sore throat. Cardiovascular: Negative for chest pain. Respiratory: Negative for shortness of breath. Gastrointestinal: Negative for abdominal pain, vomiting and diarrhea. Genitourinary: Negative for dysuria. Musculoskeletal: Negative for back pain. Skin: Positive for sacral abscess Neurological: Negative for headaches, focal weakness or numbness.  10-point ROS otherwise negative.  ____________________________________________   PHYSICAL EXAM:  VITAL SIGNS: ED Triage Vitals  Enc Vitals Group     BP 04/03/15 0053 141/80 mmHg     Pulse Rate 04/03/15 0053 88     Resp 04/03/15 0053 18     Temp 04/03/15 0053 98.4 F (36.9 C)     Temp Source 04/03/15 0053 Oral     SpO2 04/03/15 0053 96 %     Weight 04/03/15 0053 240 lb (108.863 kg)     Height 04/03/15 0053 5\' 11"  (1.803 m)     Head Cir --      Peak Flow --      Pain Score 04/03/15 0053 9  Pain Loc --      Pain Edu? --      Excl. in [place]? --      Constitutional: Alert and oriented. Well appearing and in no distress. Eyes: Conjunctivae are normal. PERRL. Normal extraocular movements. ENT   Head: Normocephalic and atraumatic.   Nose: No congestion/rhinnorhea.   Mouth/Throat: Mucous membranes are moist.   Neck: No stridor. Hematological/Lymphatic/Immunilogical: No cervical lymphadenopathy. Cardiovascular: Normal rate, regular rhythm. Normal and symmetric distal pulses are present in all extremities. No murmurs, rubs, or gallops. Respiratory: Normal respiratory effort without tachypnea nor retractions. Breath sounds are clear and equal bilaterally. No wheezes/rales/rhonchi. Gastrointestinal: Soft and nontender. No distention. There is no CVA  tenderness. Genitourinary: deferred Musculoskeletal: Nontender with normal range of motion in all extremities. No joint effusions.  No lower extremity tenderness nor edema. Neurologic:  Normal speech and language. No gross focal neurologic deficits are appreciated. Speech is normal.  Skin:  Skin is warm, dry and intact. No rash noted. Psychiatric: Mood and affect are normal. Speech and behavior are normal. Patient exhibits appropriate insight and judgment.  Procedure note:INCISION AND DRAINAGE Performed by: Marjean Donna N Consent: Verbal consent obtained. Risks and benefits: risks, benefits and alternatives were discussed Type: abscess  Body area:   Anesthesia: local infiltration  Incision was made with a scalpel.  Local anesthetic: lidocaine 1%   Anesthetic total: 5 ml  Complexity: complex Blunt dissection to break up loculations  Drainage: purulent  Drainage amount: 7 mL     Patient tolerance: Patient tolerated the procedure well with no immediate complications.      INITIAL IMPRESSION / ASSESSMENT AND PLAN / ED COURSE  Pertinent labs & imaging results that were available during my care of the patient were reviewed by me and considered in my medical decision making (see chart for details).  Patient received Bactrim DS one tab as well as Percocet for pain in the emergency department was prescribed the same for home.  ____________________________________________   FINAL CLINICAL IMPRESSION(S) / ED DIAGNOSES  Final diagnoses:  Pilonidal abscess      Gregor Hams, MD 04/07/15 2256

## 2015-04-03 NOTE — ED Notes (Signed)
Abscess to sacral area since yest, hx of the same.

## 2015-04-03 NOTE — Discharge Instructions (Signed)
Abscess An abscess is an infected area that contains a collection of pus and debris.It can occur in almost any part of the body. An abscess is also known as a furuncle or boil. CAUSES  An abscess occurs when tissue gets infected. This can occur from blockage of oil or sweat glands, infection of hair follicles, or a minor injury to the skin. As the body tries to fight the infection, pus collects in the area and creates pressure under the skin. This pressure causes pain. People with weakened immune systems have difficulty fighting infections and get certain abscesses more often.  SYMPTOMS Usually an abscess develops on the skin and becomes a painful mass that is red, warm, and tender. If the abscess forms under the skin, you may feel a moveable soft area under the skin. Some abscesses break open (rupture) on their own, but most will continue to get worse without care. The infection can spread deeper into the body and eventually into the bloodstream, causing you to feel ill.  DIAGNOSIS  Your caregiver will take your medical history and perform a physical exam. A sample of fluid may also be taken from the abscess to determine what is causing your infection. TREATMENT  Your caregiver may prescribe antibiotic medicines to fight the infection. However, taking antibiotics alone usually does not cure an abscess. Your caregiver may need to make a small cut (incision) in the abscess to drain the pus. In some cases, gauze is packed into the abscess to reduce pain and to continue draining the area. HOME CARE INSTRUCTIONS   Only take over-the-counter or prescription medicines for pain, discomfort, or fever as directed by your caregiver.  If you were prescribed antibiotics, take them as directed. Finish them even if you start to feel better.  If gauze is used, follow your caregiver's directions for changing the gauze.  To avoid spreading the infection:  Keep your draining abscess covered with a  bandage.  Wash your hands well.  Do not share personal care items, towels, or whirlpools with others.  Avoid skin contact with others.  Keep your skin and clothes clean around the abscess.  Keep all follow-up appointments as directed by your caregiver. SEEK MEDICAL CARE IF:   You have increased pain, swelling, redness, fluid drainage, or bleeding.  You have muscle aches, chills, or a general ill feeling.  You have a fever. MAKE SURE YOU:   Understand these instructions.  Will watch your condition.  Will get help right away if you are not doing well or get worse.   This information is not intended to replace advice given to you by your health care provider. Make sure you discuss any questions you have with your health care provider.   Document Released: 02/16/2005 Document Revised: 11/08/2011 Document Reviewed: 07/22/2011 Elsevier Interactive Patient Education 2016 [employer]  Pilonidal Cyst A pilonidal cyst is a fluid-filled sac. It forms beneath the skin near your tailbone, at the top of the crease of your buttocks. A pilonidal cyst that is not large or infected may not cause symptoms or problems. If the cyst becomes irritated or infected, it may fill with pus. This causes pain and swelling (pilonidal abscess). An infected cyst may need to be treated with medicine, drained, or removed. CAUSES The cause of a pilonidal cyst is not known. One cause may be a hair that grows into your skin (ingrown hair). RISK FACTORS Pilonidal cysts are more common in boys and men. Risk factors include:  Having lots of  hair near the crease of the buttocks.  Being overweight.  Having a pilonidal dimple.  Wearing tight clothing.  Not bathing or showering frequently.  Sitting for long periods of time. SIGNS AND SYMPTOMS Signs and symptoms of a pilonidal cyst may include:  Redness.  Pain and tenderness.  Warmth.  Swelling.  Pus.  Fever. DIAGNOSIS Your health care provider  may diagnose a pilonidal cyst based on your symptoms and a physical exam. The health care provider may do a blood test to check for infection. If your cyst is draining pus, your health care provider may take a sample of the drainage to be tested at a laboratory. TREATMENT Surgery is the usual treatment for an infected pilonidal cyst. You may also have to take medicines before surgery. The type of surgery you have depends on the size and severity of the infected cyst. The different kinds of surgery include:  Incision and drainage. This is a procedure to open and drain the cyst.  Marsupialization. In this procedure, a large cyst or abscess may be opened and kept open by stitching the edges of the skin to the cyst walls.  Cyst removal. This procedure involves opening the skin and removing all or part of the cyst. HOME CARE INSTRUCTIONS  Follow all of your surgeon's instructions carefully if you had surgery.  Take medicines only as directed by your health care provider.  If you were prescribed an antibiotic medicine, finish it all even if you start to feel better.  Keep the area around your pilonidal cyst clean and dry.  Clean the area as directed by your health care provider. Pat the area dry with a clean towel. Do not rub it as this may cause bleeding.  Remove hair from the area around the cyst as directed by your health care provider.  Do not wear tight clothing or sit in one place for long periods of time.  There are many different ways to close and cover an incision, including stitches, skin glue, and adhesive strips. Follow your health care provider's instructions on:  Incision care.  Bandage (dressing) changes and removal.  Incision closure removal. SEEK MEDICAL CARE IF:   You have drainage, redness, swelling, or pain at the site of the cyst.  You have a fever.   This information is not intended to replace advice given to you by your health care provider. Make sure you  discuss any questions you have with your health care provider.   Document Released: 05/06/2000 Document Revised: 05/30/2014 Document Reviewed: 09/26/2013 Elsevier Interactive Patient Education [employer].

## 2015-04-14 ENCOUNTER — Emergency Department
Admission: EM | Admit: 2015-04-14 | Discharge: 2015-04-14 | Disposition: A | Discharge status: Home / Self Care | Payer: Self-pay | Attending: Emergency Medicine | Admitting: Emergency Medicine

## 2015-04-14 DIAGNOSIS — Y9389 Activity, other specified: Secondary | ICD-10-CM | POA: Insufficient documentation

## 2015-04-14 DIAGNOSIS — Y998 Other external cause status: Secondary | ICD-10-CM | POA: Insufficient documentation

## 2015-04-14 DIAGNOSIS — S80812A Abrasion, left lower leg, initial encounter: Secondary | ICD-10-CM | POA: Insufficient documentation

## 2015-04-14 DIAGNOSIS — F17210 Nicotine dependence, cigarettes, uncomplicated: Secondary | ICD-10-CM | POA: Insufficient documentation

## 2015-04-14 DIAGNOSIS — Y9289 Other specified places as the place of occurrence of the external cause: Secondary | ICD-10-CM | POA: Insufficient documentation

## 2015-04-14 DIAGNOSIS — I889 Nonspecific lymphadenitis, unspecified: Secondary | ICD-10-CM | POA: Insufficient documentation

## 2015-04-14 DIAGNOSIS — W5503XA Scratched by cat, initial encounter: Secondary | ICD-10-CM | POA: Insufficient documentation

## 2015-04-14 DIAGNOSIS — Z792 Long term (current) use of antibiotics: Secondary | ICD-10-CM | POA: Insufficient documentation

## 2015-04-14 MED ORDER — AZITHROMYCIN 250 MG PO TABS
500.0000 mg | ORAL_TABLET | Freq: Once | ORAL | Status: AC
  Administered 2015-04-14: 500 mg via ORAL
  Filled 2015-04-14: qty 2

## 2015-04-14 MED ORDER — KETOROLAC TROMETHAMINE 10 MG PO TABS: 10.0000 mg | ORAL_TABLET | Freq: Once | ORAL | Status: DC

## 2015-04-14 MED ORDER — KETOROLAC TROMETHAMINE 10 MG PO TABS: 10.0000 mg | ORAL_TABLET | Freq: Three times a day (TID) | ORAL | Status: DC | PRN

## 2015-04-14 MED ORDER — KETOROLAC TROMETHAMINE 10 MG PO TABS
10.0000 mg | ORAL_TABLET | Freq: Once | ORAL | Status: AC
  Administered 2015-04-14: 10 mg via ORAL
  Filled 2015-04-14: qty 1

## 2015-04-14 MED ORDER — AZITHROMYCIN 250 MG PO TABS
500.0000 mg | ORAL_TABLET | Freq: Every day | ORAL | Status: AC
Start: 2015-04-14 — End: 2015-04-18

## 2015-04-14 NOTE — ED Notes (Signed)
Pt in with swollen area to left groin area, now has pain radiating down left leg and left buttocks.

## 2015-04-14 NOTE — ED Provider Notes (Signed)
[facility] Emergency Department Provider Note  ____________________________________________  Time seen: 1:30 AM  I have reviewed the triage vital signs and the nursing notes.   HISTORY  Chief Complaint Groin Pain      HPI Wesley Hart is a 29 y.o. male presents with tender swollen area in his left groin with pain radiating on the anterior portion of his left thigh. Patient states that he sustained a scratch from a cat approximately 3 weeks ago which has been poorly healing since that point     Past Medical History  Diagnosis Date  . Stomach ulcer   . Lactose intolerance     There are no active problems to display for this patient.   Past Surgical History  Procedure Laterality Date  . Fracture surgery      Current Outpatient Rx  Name  Route  Sig  Dispense  Refill  . ibuprofen (ADVIL,MOTRIN) 800 MG tablet   Oral   Take 1 tablet (800 mg total) by mouth every 8 (eight) hours as needed for mild pain or moderate pain.   15 tablet   0   . oxyCODONE-acetaminophen (PERCOCET/ROXICET) 5-325 MG per tablet   Oral   Take 1 tablet by mouth every 4 (four) hours as needed for severe pain.   12 tablet   0   . oxyCODONE-acetaminophen (PERCOCET/ROXICET) 5-325 MG tablet   Oral   Take 1 tablet by mouth every 4 (four) hours as needed for severe pain.   20 tablet   0   . oxyCODONE-acetaminophen (ROXICET) 5-325 MG per tablet   Oral   Take 1 tablet by mouth every 8 (eight) hours as needed for moderate pain or severe pain (Do not drive or operate heavy machinery while taking as can cause drowsiness.).   9 tablet   0   . sulfamethoxazole-trimethoprim (BACTRIM DS,SEPTRA DS) 800-160 MG tablet   Oral   Take 1 tablet by mouth 2 (two) times daily.   20 tablet   0   . sulfamethoxazole-trimethoprim (BACTRIM) 400-80 MG per tablet   Oral   Take 1 tablet by mouth 2 (two) times daily.   20 tablet   0   . traMADol (ULTRAM) 50 MG tablet   Oral   Take 1  tablet (50 mg total) by mouth every 8 (eight) hours as needed (Do not drive or operate machinery while taking as can cause drowsiness.).   12 tablet   0     Allergies Lactose intolerance (gi) and Prednisone  No family history on file.  Social History Social History  Substance Use Topics  . Smoking status: Current Every Day Smoker -- 0.50 packs/day    Types: Cigarettes  . Smokeless tobacco: Never Used  . Alcohol Use: No    Review of Systems  Constitutional: Negative for fever. Eyes: Negative for visual changes. ENT: Negative for sore throat. Cardiovascular: Negative for chest pain. Respiratory: Negative for shortness of breath. Gastrointestinal: Negative for abdominal pain, vomiting and diarrhea. Genitourinary: Negative for dysuria. Musculoskeletal: Negative for back pain. Skin: Positive for abrasion to the left lower extremity Neurological: Negative for headaches, focal weakness or numbness.   10-point ROS otherwise negative.  ____________________________________________   PHYSICAL EXAM:  VITAL SIGNS: ED Triage Vitals  Enc Vitals Group     BP 04/14/15 0051 123/66 mmHg     Pulse Rate 04/14/15 0050 101     Resp 04/14/15 0050 18     Temp 04/14/15 0050 99 F (37.2 C)  Temp src --      SpO2 04/14/15 0050 96 %     Weight 04/14/15 0050 238 lb (107.956 kg)     Height 04/14/15 0050 5\' 11"  (1.803 m)     Head Cir --      Peak Flow --      Pain Score 04/14/15 0051 8     Pain Loc --      Pain Edu? --      Excl. in [place]? --      Constitutional: Alert and oriented. Well appearing and in no distress. Eyes: Conjunctivae are normal. PERRL. Normal extraocular movements. ENT   Head: Normocephalic and atraumatic.   Nose: No congestion/rhinnorhea.   Mouth/Throat: Mucous membranes are moist.   Neck: No stridor. Hematological/Lymphatic/Immunilogical: No cervical lymphadenopathy. Cardiovascular: Normal rate, regular rhythm. Normal and symmetric distal pulses  are present in all extremities. No murmurs, rubs, or gallops. Respiratory: Normal respiratory effort without tachypnea nor retractions. Breath sounds are clear and equal bilaterally. No wheezes/rales/rhonchi. Gastrointestinal: Soft and nontender. No distention. There is no CVA tenderness. Palpable tender lymph node noted to left inguinal region. Genitourinary: deferred Musculoskeletal: Nontender with normal range of motion in all extremities. No joint effusions.  No lower extremity tenderness nor edema. Neurologic:  Normal speech and language. No gross focal neurologic deficits are appreciated. Speech is normal.  Skin:  Skin is warm, dry and intact. Abrasion noted to the anterior left lower leg with mild surrounding erythema Psychiatric: Mood and affect are normal. Speech and behavior are normal. Patient exhibits appropriate insight and judgment.    INITIAL IMPRESSION / ASSESSMENT AND PLAN / ED COURSE  Pertinent labs & imaging results that were available during my care of the patient were reviewed by me and considered in my medical decision making (see chart for details).    ____________________________________________   FINAL CLINICAL IMPRESSION(S) / ED DIAGNOSES  Final diagnoses:  Cat scratch of left lower leg, initial encounter  Lymphadenitis      Gregor Hams, MD 04/14/15 [phone]

## 2015-04-14 NOTE — Discharge Instructions (Signed)

## 2015-09-24 ENCOUNTER — Emergency Department
Admission: EM | Admit: 2015-09-24 | Discharge: 2015-09-24 | Disposition: A | Discharge status: Home / Self Care | Payer: Medicaid Other | Attending: Emergency Medicine | Admitting: Emergency Medicine

## 2015-09-24 ENCOUNTER — Encounter: Payer: Self-pay | Admitting: Emergency Medicine

## 2015-09-24 DIAGNOSIS — A059 Bacterial foodborne intoxication, unspecified: Secondary | ICD-10-CM

## 2015-09-24 DIAGNOSIS — Z791 Long term (current) use of non-steroidal anti-inflammatories (NSAID): Secondary | ICD-10-CM | POA: Insufficient documentation

## 2015-09-24 DIAGNOSIS — Z79899 Other long term (current) drug therapy: Secondary | ICD-10-CM | POA: Insufficient documentation

## 2015-09-24 DIAGNOSIS — T6294XA Toxic effect of unspecified noxious substance eaten as food, undetermined, initial encounter: Secondary | ICD-10-CM | POA: Insufficient documentation

## 2015-09-24 DIAGNOSIS — F17210 Nicotine dependence, cigarettes, uncomplicated: Secondary | ICD-10-CM | POA: Insufficient documentation

## 2015-09-24 LAB — COMPREHENSIVE METABOLIC PANEL
ALBUMIN: 4.7 g/dL (ref 3.5–5.0)
ALK PHOS: 54 U/L (ref 38–126)
ALT: 31 U/L (ref 17–63)
ANION GAP: 8 (ref 5–15)
AST: 29 U/L (ref 15–41)
BILIRUBIN TOTAL: 0.7 mg/dL (ref 0.3–1.2)
BUN: 12 mg/dL (ref 6–20)
CALCIUM: 9.6 mg/dL (ref 8.9–10.3)
CO2: 21 mmol/L — ABNORMAL LOW (ref 22–32)
Chloride: 106 mmol/L (ref 101–111)
Creatinine, Ser: 0.92 mg/dL (ref 0.61–1.24)
GFR calc Af Amer: >60 mL/min (ref >60)
GLUCOSE: 112 mg/dL — AB (ref 65–99)
Potassium: 4.1 mmol/L (ref 3.5–5.1)
Sodium: 135 mmol/L (ref 135–145)
TOTAL PROTEIN: 7.7 g/dL (ref 6.5–8.1)

## 2015-09-24 LAB — URINALYSIS COMPLETE WITH MICROSCOPIC (ARMC ONLY)
Bacteria, UA: NONE SEEN
Bilirubin Urine: NEGATIVE
GLUCOSE, UA: NEGATIVE mg/dL
Hgb urine dipstick: NEGATIVE
Ketones, ur: NEGATIVE mg/dL
LEUKOCYTES UA: NEGATIVE
NITRITE: NEGATIVE
Protein, ur: NEGATIVE mg/dL
RBC / HPF: NONE SEEN RBC/hpf (ref 0–5)
SPECIFIC GRAVITY, URINE: 1.024 (ref 1.005–1.030)
Squamous Epithelial / LPF: NONE SEEN
pH: 5 (ref 5.0–8.0)

## 2015-09-24 LAB — CBC
HCT: 43.4 % (ref 40.0–52.0)
HEMOGLOBIN: 14.4 g/dL (ref 13.0–18.0)
MCH: 27.8 pg (ref 26.0–34.0)
MCHC: 33.2 g/dL (ref 32.0–36.0)
MCV: 84 fL (ref 80.0–100.0)
Platelets: 208 10*3/uL (ref 150–440)
RBC: 5.17 MIL/uL (ref 4.40–5.90)
RDW: 14.2 % (ref 11.5–14.5)
WBC: 8.4 10*3/uL (ref 3.8–10.6)

## 2015-09-24 LAB — LIPASE, BLOOD: Lipase: 15 U/L (ref 11–51)

## 2015-09-24 MED ORDER — MORPHINE SULFATE (PF) 2 MG/ML IV SOLN
2.0000 mg | Freq: Once | INTRAVENOUS | Status: AC
  Administered 2015-09-24: 2 mg via INTRAVENOUS
  Filled 2015-09-24: qty 1

## 2015-09-24 MED ORDER — ONDANSETRON 4 MG PO TBDP
4.0000 mg | ORAL_TABLET | Freq: Once | ORAL | Status: AC | PRN
  Administered 2015-09-24: 4 mg via ORAL
  Filled 2015-09-24: qty 1

## 2015-09-24 MED ORDER — SODIUM CHLORIDE 0.9 % IV SOLN
Freq: Once | INTRAVENOUS | Status: AC
  Administered 2015-09-24: 13:00:00 via INTRAVENOUS
  Filled 2015-09-24: qty 1000

## 2015-09-24 NOTE — ED Notes (Signed)
Pt reports that he ate [employer] at 0100, was awakened at 0300 with vomiting and diarrhea. Pt states he has vomited several times and had diarrhea more than that. Pt's last emesis around 0930. Pt alert & oriented with NAD noted.

## 2015-09-24 NOTE — ED Provider Notes (Signed)
[facility] Emergency Department Provider Note   ____________________________________________  Time seen: Approximately 1:10 PM  I have reviewed the triage vital signs and the nursing notes.   HISTORY  Chief Complaint Abdominal Pain; Diarrhea; and Vomiting  HPI Wesley Hart is a 30 y.o. male reports he ate a [employer] yesterday. Woke up at 3:00 this morning with crampy abdominal pain nausea vomiting diarrhea. Last vomiting or diarrhea was about 9:30 today. Patient has had no other vomiting or diarrhea since then he still having crampy abdominal pain no. Crampy abdominal pain is in the upper abdomen. Moderate in nature and not running a fever. Feels like the last time he had food poisoning.  Past Medical History  Diagnosis Date  . Stomach ulcer   . Lactose intolerance     There are no active problems to display for this patient.   Past Surgical History  Procedure Laterality Date  . Fracture surgery      Current Outpatient Rx  Name  Route  Sig  Dispense  Refill  . ibuprofen (ADVIL,MOTRIN) 800 MG tablet   Oral   Take 1 tablet (800 mg total) by mouth every 8 (eight) hours as needed for mild pain or moderate pain.   15 tablet   0   . ketorolac (TORADOL) 10 MG tablet   Oral   Take 1 tablet (10 mg total) by mouth every 8 (eight) hours as needed.   20 tablet   0   . oxyCODONE-acetaminophen (PERCOCET/ROXICET) 5-325 MG per tablet   Oral   Take 1 tablet by mouth every 4 (four) hours as needed for severe pain.   12 tablet   0   . oxyCODONE-acetaminophen (PERCOCET/ROXICET) 5-325 MG tablet   Oral   Take 1 tablet by mouth every 4 (four) hours as needed for severe pain.   20 tablet   0   . oxyCODONE-acetaminophen (ROXICET) 5-325 MG per tablet   Oral   Take 1 tablet by mouth every 8 (eight) hours as needed for moderate pain or severe pain (Do not drive or operate heavy machinery while taking as can cause drowsiness.).   9 tablet   0   .  sulfamethoxazole-trimethoprim (BACTRIM DS,SEPTRA DS) 800-160 MG tablet   Oral   Take 1 tablet by mouth 2 (two) times daily.   20 tablet   0   . sulfamethoxazole-trimethoprim (BACTRIM) 400-80 MG per tablet   Oral   Take 1 tablet by mouth 2 (two) times daily.   20 tablet   0   . traMADol (ULTRAM) 50 MG tablet   Oral   Take 1 tablet (50 mg total) by mouth every 8 (eight) hours as needed (Do not drive or operate machinery while taking as can cause drowsiness.).   12 tablet   0     Allergies Lactose intolerance (gi) and Prednisone  No family history on file.  Social History Social History  Substance Use Topics  . Smoking status: Current Every Day Smoker -- 0.50 packs/day    Types: Cigarettes  . Smokeless tobacco: Never Used  . Alcohol Use: Yes     Comment: occasionally    Review of Systems Constitutional: No fever/chills Eyes: No visual changes. ENT: No sore throat. Cardiovascular: Denies chest pain. Respiratory: Denies shortness of breath. Gastrointestinal: See history of present illness Genitourinary: Negative for dysuria. Musculoskeletal: Negative for back pain. Skin: Negative for rash. Neurological: Negative for headaches, focal weakness or numbness.  10-point ROS otherwise negative.  ____________________________________________  PHYSICAL EXAM:  VITAL SIGNS: ED Triage Vitals  Enc Vitals Group     BP 09/24/15 1031 130/67 mmHg     Pulse Rate 09/24/15 1031 82     Resp 09/24/15 1031 18     Temp 09/24/15 1031 98.4 F (36.9 C)     Temp Source 09/24/15 1031 Oral     SpO2 09/24/15 1031 100 %     Weight 09/24/15 1031 245 lb (111.131 kg)     Height 09/24/15 1031 5\' 11"  (1.803 m)     Head Cir --      Peak Flow --      Pain Score 09/24/15 1032 8     Pain Loc --      Pain Edu? --      Excl. in [place]? --     Constitutional: Alert and oriented. Well appearing and in no acute distress. Eyes: Conjunctivae are normal. PERRL. EOMI. Head: Atraumatic. Nose: No  congestion/rhinnorhea. Mouth/Throat: Mucous membranes are moist.  Oropharynx non-erythematous. Neck: No stridor.  Cardiovascular: Normal rate, regular rhythm. Grossly normal heart sounds.  Good peripheral circulation. Respiratory: Normal respiratory effort.  No retractions. Lungs CTAB. Gastrointestinal: Soft and nontender. No distention. No abdominal bruits. No CVA tenderness. Musculoskeletal: No lower extremity tenderness nor edema.  No joint effusions. Neurologic:  Normal speech and language. No gross focal neurologic deficits are appreciated. No gait instability. Skin:  Skin is warm, dry and intact. No rash noted. Psychiatric: Mood and affect are normal. Speech and behavior are normal.  ____________________________________________   LABS (all labs ordered are listed, but only abnormal results are displayed)  Labs Reviewed  COMPREHENSIVE METABOLIC PANEL - Abnormal; Notable for the following:    CO2 21 (*)    Glucose, Bld 112 (*)    All other components within normal limits  LIPASE, BLOOD  CBC  URINALYSIS COMPLETEWITH MICROSCOPIC ([facility] ONLY)   ____________________________________________  EKG  KG read and interpreted by me shows sinus bradycardia rate of 55 normal axis no acute ST-T wave changes. There is a QRS duration 100 ms R prime in V1. ____________________________________________  RADIOLOGY   ____________________________________________   PROCEDURES  ____________________________________________   INITIAL IMPRESSION / ASSESSMENT AND PLAN / ED COURSE  Pertinent labs & imaging results that were available during my care of the patient were reviewed by me and considered in my medical decision making (see chart for details).  After 2 mg morphine and 1 L fluid patient feels much better does not have any further vomiting or diarrhea will discharge him home ____________________________________________   FINAL CLINICAL IMPRESSION(S) / ED DIAGNOSES  Final diagnoses:   Food poisoning      NEW MEDICATIONS STARTED DURING THIS VISIT:  New Prescriptions   No medications on file     Note:  This document was prepared using Dragon voice recognition software and may include unintentional dictation errors.    Nena Polio, MD 09/24/15 [phone]

## 2015-09-24 NOTE — ED Notes (Signed)
Patient presents to the ED with centralized upper abdominal pain, nausea, vomiting and diarrhea that began at 3am this morning, waking patient up out of sleep.  Patient reports eating 2 double cheeseburgers from [employer] last night around 1AM.  Patient reports being "severely lactose intolerant".  Patient states, "I think I got food poisoning again."  Patient reports having a similar episode after eating [employer] about 6 months ago.  Patient is alert and oriented x 4.  Reports feeling better sitting rather than standing.

## 2015-09-24 NOTE — Discharge Instructions (Signed)
Food Poisoning °Food poisoning is an illness caused by something you ate or drank. There are over 250 known causes of food poisoning. However, many other causes are unknown. You can be treated even if the exact cause of your food poisoning is not known. In most cases, food poisoning is mild and lasts 1 to 2 days. However, some cases can be serious, especially for people with low immune systems, the elderly, children and infants, and pregnant women. °CAUSES  °Poor personal hygiene, improper cleaning of storage and preparation areas, and unclean utensils can cause infection or tainting (contamination) of foods. The causes of food poisoning are numerous. Infectious agents, such as viruses, bacteria, or parasites, can cause harm by infecting the intestine and disrupting the absorption of nutrients and water. This can cause diarrhea and lead to dehydration. Viruses are responsible for most of the food poisonings in which an agent is found. Parasites are less likely to cause food poisoning. Toxic agents, such as poisonous mushrooms, marine algae, and pesticides can also cause food poisoning. °· Viral causes of food poisoning include: °¨ Norovirus. °¨ Rotavirus. °¨ Hepatitis A. °· Bacterial causes of food poisoning include: °¨ Salmonellae. °¨ Campylobacter. °¨ Bacillus cereus. °¨ Escherichia coli (E. coli). °¨ Shigella. °¨ Listeria monocytogenes. °¨ Clostridium botulinum (botulism). °¨ Vibrio cholerae. °· Parasites that can cause food poisoning include: °¨ Giardia. °¨ Cryptosporidium. °¨ Toxoplasma. °SYMPTOMS °Symptoms may appear several hours or longer after consuming the contaminated food or drink. Symptoms may include: °· Nausea. °· Vomiting. °· Cramping. °· Diarrhea. °· Fever and chills. °· Muscle aches. °DIAGNOSIS °Your health care provider may be able to diagnose food poisoning from a list of what you have recently eaten and results from lab tests. Diagnostic tests may include an exam of the feces. °TREATMENT °In  most cases, treatment focuses on helping to relieve your symptoms and staying well hydrated. Antibiotic medicines are rarely needed. In severe cases, hospitalization may be required. °HOME CARE INSTRUCTIONS  °· Drink enough water and fluids to keep your urine clear or pale yellow. Drink small amounts of fluids frequently and increase as tolerated. °· Ask your health care provider for specific rehydration instructions. °· Avoid: °¨ Foods high in sugar. °¨ Alcohol. °¨ Carbonated drinks. °¨ Tobacco. °¨ Juice. °¨ Caffeine drinks. °¨ Extremely hot or cold fluids. °¨ Fatty, greasy foods. °¨ Too much intake of anything at one time. °¨ Dairy products until 24 to 48 hours after diarrhea stops. °· You may consume probiotics. Probiotics are active cultures of beneficial bacteria. They may lessen the amount and number of diarrheal stools in adults. Probiotics can be found in yogurt with active cultures and in supplements. °· Wash your hands well to avoid spreading the bacteria. °· Take medicines only as directed by your health care provider. Do not give your child aspirin because of the association with Reye's syndrome. °· Ask your health care provider if you should continue to take your regular prescribed and over-the-counter medicines. °PREVENTION  °· Wash your hands, food preparation surfaces, and utensils thoroughly before and after handling raw foods. °· Keep refrigerated foods below 40°F (5°C). °· Serve hot foods immediately or keep them heated above 140°F (60°C). °· Divide large volumes of food into small portions for rapid cooling in the refrigerator. Hot, bulky foods in the refrigerator can raise the temperature of other foods that have already cooled. °· Follow approved canning procedures. °· Heat canned foods thoroughly before tasting. °· When in doubt, throw it out. °· Infants, the elderly, women   who are pregnant, and people with compromised immune systems are especially susceptible to food poisoning. These people  should never consume unpasteurized cheese, unpasteurized cider, raw fish, raw seafood, or raw meat-type products. SEEK IMMEDIATE MEDICAL CARE IF:   You have difficulty breathing, swallowing, talking, or moving.  You develop blurred vision.  You are unable to keep fluids down.  You faint or nearly faint.  Your eyes turn yellow.  Vomiting or diarrhea develops or becomes persistent.  Abdominal pain develops, increases, or localizes in one small area.  You have a fever.  The diarrhea becomes excessive or contains blood or mucus.  You develop excessive weakness, dizziness, or extreme thirst.  You have no urine for 8 hours. MAKE SURE YOU:   Understand these instructions.  Will watch your condition.  Will get help right away if you are not doing well or get worse.   This information is not intended to replace advice given to you by your health care provider. Make sure you discuss any questions you have with your health care provider.   Document Released: 02/05/2004 Document Revised: 05/30/2014 Document Reviewed: 11/10/2014 Elsevier Interactive Patient Education 2016 [employer].   Please return for fever or further vomiting or worsening pain. You can try the charcoal tablets they may help remember that make your stool black.

## 2015-09-24 NOTE — ED Notes (Signed)
Pt discharged home after verbalizing understanding of discharge instructions; nad noted. 

## 2016-06-02 ENCOUNTER — Encounter: Payer: Self-pay | Admitting: Emergency Medicine

## 2016-06-02 ENCOUNTER — Emergency Department
Admission: EM | Admit: 2016-06-02 | Discharge: 2016-06-02 | Disposition: A | Discharge status: Home / Self Care | Payer: BLUE CROSS/BLUE SHIELD | Attending: Emergency Medicine | Admitting: Emergency Medicine

## 2016-06-02 ENCOUNTER — Emergency Department: Payer: BLUE CROSS/BLUE SHIELD

## 2016-06-02 DIAGNOSIS — M5440 Lumbago with sciatica, unspecified side: Secondary | ICD-10-CM | POA: Insufficient documentation

## 2016-06-02 DIAGNOSIS — G8929 Other chronic pain: Secondary | ICD-10-CM

## 2016-06-02 DIAGNOSIS — M5412 Radiculopathy, cervical region: Secondary | ICD-10-CM

## 2016-06-02 DIAGNOSIS — M545 Low back pain: Secondary | ICD-10-CM | POA: Diagnosis present

## 2016-06-02 NOTE — Discharge Instructions (Signed)
X-ray findings will be sent to Dr. Ouida Sills to be available for follow-up care.
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
## 2016-08-20 ENCOUNTER — Encounter: Payer: Self-pay | Admitting: Emergency Medicine

## 2016-08-20 DIAGNOSIS — R42 Dizziness and giddiness: Secondary | ICD-10-CM | POA: Diagnosis not present

## 2016-08-20 DIAGNOSIS — F17210 Nicotine dependence, cigarettes, uncomplicated: Secondary | ICD-10-CM | POA: Insufficient documentation

## 2016-08-20 LAB — GLUCOSE, CAPILLARY: GLUCOSE-CAPILLARY: 123 mg/dL — AB (ref 65–99)

## 2016-08-20 NOTE — ED Triage Notes (Signed)
Pt to triage via wheelchair. 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 Pt also reports a "sinking feeling" in my stomach. Pt states he still feel like he is going to pass out.

## 2016-08-21 ENCOUNTER — Emergency Department
Admission: EM | Admit: 2016-08-21 | Discharge: 2016-08-21 | Disposition: A | Discharge status: Home / Self Care | Payer: BLUE CROSS/BLUE SHIELD | Attending: Emergency Medicine | Admitting: Emergency Medicine

## 2016-08-21 DIAGNOSIS — R42 Dizziness and giddiness: Secondary | ICD-10-CM

## 2016-08-21 LAB — BASIC METABOLIC PANEL
Anion gap: 6 (ref 5–15)
BUN: 15 mg/dL (ref 6–20)
CO2: 24 mmol/L (ref 22–32)
CREATININE: 1.17 mg/dL (ref 0.61–1.24)
Calcium: 9.3 mg/dL (ref 8.9–10.3)
Chloride: 106 mmol/L (ref 101–111)
GFR calc Af Amer: >60 mL/min (ref >60)
Glucose, Bld: 132 mg/dL — ABNORMAL HIGH (ref 65–99)
POTASSIUM: 3.6 mmol/L (ref 3.5–5.1)
SODIUM: 136 mmol/L (ref 135–145)

## 2016-08-21 LAB — URINALYSIS, COMPLETE (UACMP) WITH MICROSCOPIC
BILIRUBIN URINE: NEGATIVE
GLUCOSE, UA: NEGATIVE mg/dL
HGB URINE DIPSTICK: NEGATIVE
Ketones, ur: NEGATIVE mg/dL
LEUKOCYTES UA: NEGATIVE
NITRITE: NEGATIVE
Protein, ur: NEGATIVE mg/dL
SPECIFIC GRAVITY, URINE: 1.026 (ref 1.005–1.030)
pH: 5 (ref 5.0–8.0)

## 2016-08-21 LAB — CBC
HCT: 41.7 % (ref 40.0–52.0)
Hemoglobin: 14.3 g/dL (ref 13.0–18.0)
MCH: 29.3 pg (ref 26.0–34.0)
MCHC: 34.3 g/dL (ref 32.0–36.0)
MCV: 85.4 fL (ref 80.0–100.0)
PLATELETS: 204 10*3/uL (ref 150–440)
RBC: 4.88 MIL/uL (ref 4.40–5.90)
RDW: 13.6 % (ref 11.5–14.5)
WBC: 7.9 10*3/uL (ref 3.8–10.6)

## 2016-08-21 LAB — TROPONIN I: Troponin I: <0.03 ng/mL (ref <0.03)

## 2016-08-21 MED ORDER — OXYMETAZOLINE HCL 0.05 % NA SOLN
1.0000 | Freq: Once | NASAL | Status: AC
  Administered 2016-08-21: 1 via NASAL
  Filled 2016-08-21: qty 15

## 2016-08-21 MED ORDER — SODIUM CHLORIDE 0.9 % IV BOLUS (SEPSIS)
1000.0000 mL | Freq: Once | INTRAVENOUS | Status: AC
  Administered 2016-08-21: 1000 mL via INTRAVENOUS

## 2016-08-21 MED ORDER — MECLIZINE HCL 25 MG PO TABS
25.0000 mg | ORAL_TABLET | Freq: Once | ORAL | Status: AC
  Administered 2016-08-21: 25 mg via ORAL
  Filled 2016-08-21: qty 1

## 2016-08-21 MED ORDER — OXYMETAZOLINE HCL 0.05 % NA SOLN: 2.0000 | Freq: Two times a day (BID) | NASAL | 0 refills | Status: AC

## 2016-08-21 MED ORDER — MECLIZINE HCL 25 MG PO TABS: 25.0000 mg | ORAL_TABLET | Freq: Three times a day (TID) | ORAL | 0 refills | Status: DC | PRN

## 2016-08-21 NOTE — ED Provider Notes (Signed)
[facility] Emergency Department Provider Note   ____________________________________________   First MD Initiated Contact with Patient 08/21/16 [phone]     (approximate)  I have reviewed the triage vital signs and the nursing notes.   HISTORY  Chief Complaint Dizziness and Hot Flashes    HPI Wesley Hart is a 31 y.o. male who comes into the hospital today with some hot flashes and dizziness. The patient reports that the symptoms started around 9 PM. He reports that he felt like he was going to pass out. The patient reports that it feels as the room is spinning and he has to keep his eyes closed to keep it from happening. He said that the hot flashes stopped and he developed cold chills and the spinning seems worse. He also has a little bit of pain in his lower back. The patient has never had these symptoms before. He denies any headache or vomiting but he did have some nausea. He said no ear pain and denies any abdominal pain or blurred vision. 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   Past Medical History:  Diagnosis Date  . Lactose intolerance   . Stomach ulcer     There are no active problems to display for this patient.   Past Surgical History:  Procedure Laterality Date  . FRACTURE SURGERY      Prior to Admission medications   Medication Sig Start Date End Date Taking? Authorizing Provider  ibuprofen (ADVIL,MOTRIN) 800 MG tablet Take 1 tablet (800 mg total) by mouth every 8 (eight) hours as needed for mild pain or moderate pain. 12/06/14   Marylene Land, NP  ketorolac (TORADOL) 10 MG tablet Take 1 tablet (10 mg total) by mouth every 8 (eight) hours as needed. 04/14/15   Gregor Hams, MD  meclizine (ANTIVERT) 25 MG tablet Take 1 tablet (25 mg total) by mouth 3 (three) times daily as needed for dizziness. 08/21/16   Loney Hering, MD  oxyCODONE-acetaminophen (PERCOCET/ROXICET) 5-325 MG per tablet Take 1 tablet by mouth  every 4 (four) hours as needed for severe pain. 01/20/15   Gregor Hams, MD  oxyCODONE-acetaminophen (PERCOCET/ROXICET) 5-325 MG tablet Take 1 tablet by mouth every 4 (four) hours as needed for severe pain. 04/03/15   Gregor Hams, MD  oxyCODONE-acetaminophen (ROXICET) 5-325 MG per tablet Take 1 tablet by mouth every 8 (eight) hours as needed for moderate pain or severe pain (Do not drive or operate heavy machinery while taking as can cause drowsiness.). 12/06/14   Marylene Land, NP  oxymetazoline (AFRIN) 0.05 % nasal spray Place 2 sprays into both nostrils 2 (two) times daily. 08/21/16 08/24/16  Loney Hering, MD  sulfamethoxazole-trimethoprim (BACTRIM DS,SEPTRA DS) 800-160 MG tablet Take 1 tablet by mouth 2 (two) times daily. 04/03/15   Gregor Hams, MD  sulfamethoxazole-trimethoprim (BACTRIM) 400-80 MG per tablet Take 1 tablet by mouth 2 (two) times daily. 12/06/14   Marylene Land, NP  traMADol (ULTRAM) 50 MG tablet Take 1 tablet (50 mg total) by mouth every 8 (eight) hours as needed (Do not drive or operate machinery while taking as can cause drowsiness.). 10/13/14   Marylene Land, NP    Allergies Lactose intolerance (gi) and Prednisone  History reviewed. No pertinent family history.  Social History Social History  Substance Use Topics  . Smoking status: Current Every Day Smoker    Packs/day: 0.50    Types: Cigarettes  . Smokeless tobacco: Never Used  .  Alcohol use Yes     Comment: occasionally    Review of Systems Constitutional: Hot flashes and cold chills Eyes: No visual changes. ENT: No sore throat. Cardiovascular: Denies chest pain. Respiratory: Denies shortness of breath. Gastrointestinal: Nausea with No abdominal pain. no vomiting.  No diarrhea.  No constipation. Genitourinary: Negative for dysuria. Musculoskeletal: Negative for back pain. Skin: Negative for rash. Neurological: Dizziness  10-point ROS otherwise  negative.  ____________________________________________   PHYSICAL EXAM:  VITAL SIGNS: ED Triage Vitals  Enc Vitals Group     BP 08/20/16 2358 139/78     Pulse Rate 08/20/16 2358 (!) 57     Resp 08/20/16 2358 18     Temp 08/20/16 2358 97.8 F (36.6 C)     Temp Source 08/20/16 2358 Oral     SpO2 08/20/16 2358 95 %     Weight 08/20/16 2358 240 lb (108.9 kg)     Height 08/20/16 2358 5\' 10"  (1.778 m)     Head Circumference --      Peak Flow --      Pain Score 08/20/16 2357 8     Pain Loc --      Pain Edu? --      Excl. in [place]? --     Constitutional: Alert and oriented. Well appearing and in moderate distress. Ears: Left TM with some effusion and no erythema right TM gray flattened dull. Eyes: Conjunctivae are normal. PERRL. EOMI. Head: Atraumatic. Nose: No congestion/rhinnorhea. Mouth/Throat: Mucous membranes are moist.  Oropharynx non-erythematous. Cardiovascular: Normal rate, regular rhythm. Grossly normal heart sounds.  Good peripheral circulation. Respiratory: Normal respiratory effort.  No retractions. Lungs CTAB. Gastrointestinal: Soft and nontender. No distention. Positive bowel sounds Musculoskeletal: No lower extremity tenderness nor edema.   Neurologic:  Normal speech and language. Cranial nerves II through XII are grossly intact with no focal motor or neuro deficits Skin:  Skin is warm, dry and intact.  Psychiatric: Mood and affect are normal.   ____________________________________________   LABS (all labs ordered are listed, but only abnormal results are displayed)  Labs Reviewed  BASIC METABOLIC PANEL - Abnormal; Notable for the following:       Result Value   Glucose, Bld 132 (*)    All other components within normal limits  URINALYSIS, COMPLETE (UACMP) WITH MICROSCOPIC - Abnormal; Notable for the following:    Color, Urine YELLOW (*)    APPearance HAZY (*)    Bacteria, UA RARE (*)    Squamous Epithelial / LPF 0-5 (*)    All other components within  normal limits  GLUCOSE, CAPILLARY - Abnormal; Notable for the following:    Glucose-Capillary 123 (*)    All other components within normal limits  CBC  TROPONIN I  CBG MONITORING, ED   ____________________________________________  EKG  ED ECG REPORT I, Loney Hering, the attending physician, personally viewed and interpreted this ECG.   Date: 08/20/2016  EKG Time: 2356  Rate: 55  Rhythm: sinus bradycardia  Axis: normal  Intervals:none  ST&T Change: none  ____________________________________________  RADIOLOGY  none ____________________________________________   PROCEDURES  Procedure(s) performed: None  Procedures  Critical Care performed: No  ____________________________________________   INITIAL IMPRESSION / ASSESSMENT AND PLAN / ED COURSE  Pertinent labs & imaging results that were available during my care of the patient were reviewed by me and considered in my medical decision making (see chart for details).  This is a 31 year old male who comes into the hospital today with some  dizziness as well as some cold chills and hot flashes. The patient reports that the room is spinning. I did order the patient a liter of normal saline as well as some meclizine. He has fluid in his left ear is also ordered some Afrin. The patient's symptoms may be due to the fluid in his ear. I will reassess the patient once he is received his medications and his fluids are completed.  Clinical Course as of Aug 22 550  Sun Aug 21, 2016  1194 I did reassess the patient. He reports that his dizziness is much improved after a liter and some meclizine. I also gave him some Afrin for the left ear effusion. He will be discharged to home with some meclizine and some Afrin. He should follow-up with his primary care physician.  [AW]    Clinical Course User Index [AW] Loney Hering, MD     ____________________________________________   FINAL CLINICAL IMPRESSION(S) / ED  DIAGNOSES  Final diagnoses:  Dizziness  Vertigo      NEW MEDICATIONS STARTED DURING THIS VISIT:  New Prescriptions   MECLIZINE (ANTIVERT) 25 MG TABLET    Take 1 tablet (25 mg total) by mouth 3 (three) times daily as needed for dizziness.   OXYMETAZOLINE (AFRIN) 0.05 % NASAL SPRAY    Place 2 sprays into both nostrils 2 (two) times daily.     Note:  This document was prepared using Dragon voice recognition software and may include unintentional dictation errors.    Loney Hering, MD 08/21/16 [phone]

## 2016-08-21 NOTE — ED Notes (Signed)
Up to stat desk inquiring about wait time.  Explain process and patient verbalized understanding.

## 2016-09-14 ENCOUNTER — Emergency Department
Admission: EM | Admit: 2016-09-14 | Discharge: 2016-09-15 | Disposition: A | Discharge status: Home / Self Care | Payer: BLUE CROSS/BLUE SHIELD | Attending: Emergency Medicine | Admitting: Emergency Medicine

## 2016-09-14 ENCOUNTER — Emergency Department: Payer: BLUE CROSS/BLUE SHIELD

## 2016-09-14 DIAGNOSIS — S82122A Displaced fracture of lateral condyle of left tibia, initial encounter for closed fracture: Secondary | ICD-10-CM | POA: Insufficient documentation

## 2016-09-14 DIAGNOSIS — Z79899 Other long term (current) drug therapy: Secondary | ICD-10-CM | POA: Insufficient documentation

## 2016-09-14 DIAGNOSIS — Y999 Unspecified external cause status: Secondary | ICD-10-CM | POA: Insufficient documentation

## 2016-09-14 DIAGNOSIS — Y939 Activity, unspecified: Secondary | ICD-10-CM | POA: Insufficient documentation

## 2016-09-14 DIAGNOSIS — R400 Somnolence: Secondary | ICD-10-CM | POA: Diagnosis not present

## 2016-09-14 DIAGNOSIS — R1032 Left lower quadrant pain: Secondary | ICD-10-CM | POA: Diagnosis not present

## 2016-09-14 DIAGNOSIS — Y92410 Unspecified street and highway as the place of occurrence of the external cause: Secondary | ICD-10-CM | POA: Insufficient documentation

## 2016-09-14 DIAGNOSIS — R1013 Epigastric pain: Secondary | ICD-10-CM | POA: Diagnosis not present

## 2016-09-14 DIAGNOSIS — R072 Precordial pain: Secondary | ICD-10-CM | POA: Diagnosis not present

## 2016-09-14 DIAGNOSIS — S8992XA Unspecified injury of left lower leg, initial encounter: Secondary | ICD-10-CM | POA: Diagnosis present

## 2016-09-14 DIAGNOSIS — S82142A Displaced bicondylar fracture of left tibia, initial encounter for closed fracture: Secondary | ICD-10-CM

## 2016-09-14 DIAGNOSIS — F17210 Nicotine dependence, cigarettes, uncomplicated: Secondary | ICD-10-CM | POA: Diagnosis not present

## 2016-09-14 LAB — CBC
HEMATOCRIT: 41.2 % (ref 40.0–52.0)
HEMOGLOBIN: 14.1 g/dL (ref 13.0–18.0)
MCH: 29.2 pg (ref 26.0–34.0)
MCHC: 34.3 g/dL (ref 32.0–36.0)
MCV: 85.4 fL (ref 80.0–100.0)
Platelets: 195 10*3/uL (ref 150–440)
RBC: 4.83 MIL/uL (ref 4.40–5.90)
RDW: 13.7 % (ref 11.5–14.5)
WBC: 5.8 10*3/uL (ref 3.8–10.6)

## 2016-09-14 LAB — TROPONIN I: Troponin I: <0.03 ng/mL (ref <0.03)

## 2016-09-14 LAB — COMPREHENSIVE METABOLIC PANEL
ALT: 25 U/L (ref 17–63)
ANION GAP: 8 (ref 5–15)
AST: 29 U/L (ref 15–41)
Albumin: 3.9 g/dL (ref 3.5–5.0)
Alkaline Phosphatase: 46 U/L (ref 38–126)
BUN: 13 mg/dL (ref 6–20)
CHLORIDE: 109 mmol/L (ref 101–111)
CO2: 23 mmol/L (ref 22–32)
Calcium: 9.2 mg/dL (ref 8.9–10.3)
Creatinine, Ser: 1 mg/dL (ref 0.61–1.24)
GFR calc Af Amer: >60 mL/min (ref >60)
GFR calc non Af Amer: >60 mL/min (ref >60)
GLUCOSE: 133 mg/dL — AB (ref 65–99)
POTASSIUM: 3.3 mmol/L — AB (ref 3.5–5.1)
Sodium: 140 mmol/L (ref 135–145)
Total Bilirubin: 0.4 mg/dL (ref 0.3–1.2)
Total Protein: 6.8 g/dL (ref 6.5–8.1)

## 2016-09-14 LAB — PROTIME-INR
INR: 0.9
Prothrombin Time: 12.1 seconds (ref 11.4–15.2)

## 2016-09-14 LAB — ETHANOL: Alcohol, Ethyl (B): <5 mg/dL (ref <5)

## 2016-09-14 LAB — LACTIC ACID, PLASMA: LACTIC ACID, VENOUS: 1.4 mmol/L (ref 0.5–1.9)

## 2016-09-14 MED ORDER — IOPAMIDOL (ISOVUE-300) INJECTION 61%
100.0000 mL | Freq: Once | INTRAVENOUS | Status: AC | PRN
  Administered 2016-09-14: 100 mL via INTRAVENOUS

## 2016-09-14 MED ORDER — ONDANSETRON HCL 4 MG/2ML IJ SOLN
4.0000 mg | Freq: Once | INTRAMUSCULAR | Status: AC
  Administered 2016-09-14: 4 mg via INTRAVENOUS
  Filled 2016-09-14: qty 2

## 2016-09-14 MED ORDER — MORPHINE SULFATE (PF) 4 MG/ML IV SOLN
8.0000 mg | Freq: Once | INTRAVENOUS | Status: AC
  Administered 2016-09-14: 8 mg via INTRAVENOUS
  Filled 2016-09-14: qty 2

## 2016-09-14 MED ORDER — SODIUM CHLORIDE 0.9 % IV BOLUS (SEPSIS)
1000.0000 mL | Freq: Once | INTRAVENOUS | Status: AC
  Administered 2016-09-14: 1000 mL via INTRAVENOUS

## 2016-09-14 MED ORDER — HYDROMORPHONE HCL 1 MG/ML IJ SOLN
1.0000 mg | Freq: Once | INTRAMUSCULAR | Status: AC
  Administered 2016-09-14: 1 mg via INTRAVENOUS
  Filled 2016-09-14: qty 1

## 2016-09-14 NOTE — ED Notes (Signed)
Patient transported to CT 
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
## 2016-09-14 NOTE — ED Notes (Signed)
Pt given his listed emergency contact, Darlene Hinston (mother), phone number

## 2016-09-15 MED ORDER — OXYCODONE HCL 5 MG PO TABS
ORAL_TABLET | ORAL | Status: AC
  Administered 2016-09-15: 5 mg via ORAL
  Filled 2016-09-15: qty 1

## 2016-09-15 MED ORDER — OXYCODONE HCL 5 MG PO TABS
5.0000 mg | ORAL_TABLET | Freq: Once | ORAL | Status: AC
  Administered 2016-09-15: 5 mg via ORAL

## 2016-09-15 MED ORDER — ACETAMINOPHEN 500 MG PO TABS
1000.0000 mg | ORAL_TABLET | Freq: Once | ORAL | Status: AC
  Administered 2016-09-15: 1000 mg via ORAL

## 2016-09-15 MED ORDER — OXYCODONE-ACETAMINOPHEN 5-325 MG PO TABS: 1.0000 | ORAL_TABLET | Freq: Four times a day (QID) | ORAL | 0 refills | Status: DC | PRN

## 2016-09-15 MED ORDER — ACETAMINOPHEN 500 MG PO TABS
ORAL_TABLET | ORAL | Status: AC
  Administered 2016-09-15: 1000 mg via ORAL
  Filled 2016-09-15: qty 2
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
## 2016-09-15 NOTE — Discharge Instructions (Signed)
Uses crutches and immobilizer until cleared by Orthopedics. Do not bear weight on your left leg until cleared by Orthopedics. Follow-up with Dr. Roland Rack in 3-5 days.  Return to the ED if you develop a sudden or severe headache, confusion, slurred speech, facial droop, weakness or numbness in any arm or leg,  extreme fatigue, vomiting more than two times, severe abdominal pain, chest pain, difficulty breathing, or other symptoms that concern you.

## 2016-09-20 ENCOUNTER — Encounter
Admission: RE | Admit: 2016-09-20 | Discharge: 2016-09-20 | Disposition: A | Discharge status: Home / Self Care | Payer: BLUE CROSS/BLUE SHIELD | Source: Ambulatory Visit | Attending: Surgery | Admitting: Surgery

## 2016-09-20 NOTE — Patient Instructions (Signed)
  Your procedure is scheduled on: 09-22-16 (Thursday) Report to Same Day Surgery 2nd floor medical mall [facility] Entrance-take elevator on left to 2nd floor.  Check in with surgery information desk.) To find out your arrival time please call [phone] between 1PM - 3PM on 09-21-16 (Wednesday)  Remember: Instructions that are not followed completely may result in serious medical risk, up to and including death, or upon the discretion of your surgeon and anesthesiologist your surgery may need to be rescheduled.    _x___ 1. Do not eat food or drink liquids after midnight. No gum chewing or hard candies.     __x__ 2. No Alcohol for 24 hours before or after surgery.   __x__3. No Smoking for 24 prior to surgery.   ____  4. Bring all medications with you on the day of surgery if instructed.    __x__ 5. Notify your doctor if there is any change in your medical condition     (cold, fever, infections).     Do not wear jewelry, make-up, hairpins, clips or nail polish.  Do not wear lotions, powders, or perfumes. You may wear deodorant.  Do not shave 48 hours prior to surgery. Men may shave face and neck.  Do not bring valuables to the hospital.    [facility] is not responsible for any belongings or valuables.               Contacts, dentures or bridgework may not be worn into surgery.  Leave your suitcase in the car. After surgery it may be brought to your room.  For patients admitted to the hospital, discharge time is determined by your treatment team.   Patients discharged the day of surgery will not be allowed to drive home.  You will need someone to drive you home and stay with you the night of your procedure.    Please read over the following fact sheets that you were given:   [facility] Preparing for Surgery and or MRSA Information   _x___ Take anti-hypertensive (unless it includes a diuretic), cardiac, seizure, asthma,     anti-reflux and psychiatric medicines. These include:  1.  MAY TAKE OXYCODONE FOR PAIN IF NEEDED AM OF SURGERY WITH A SMALL SIP OF WATER  2.  3.  4.  5.  6.  ____Fleets enema or Magnesium Citrate as directed.   _x___ Use CHG Soap or sage wipes as directed on instruction sheet   ____ Use inhalers on the day of surgery and bring to hospital day of surgery  ____ Stop Metformin and Janumet 2 days prior to surgery.    ____ Take 1/2 of usual insulin dose the night before surgery and none on the morning surgery.   ____ Follow recommendations from Cardiologist, Pulmonologist or PCP regarding stopping Aspirin, Coumadin, Pllavix ,Eliquis, Effient, or Pradaxa, and Pletal.  X____Stop Anti-inflammatories such as Advil, Aleve, IBUPROFEN, Motrin, Naproxen, Naprosyn, Goodies powders or aspirin products NOW-OK to take Tylenol    ____ Stop supplements until after surgery   ____ Bring C-Pap to the hospital.

## 2016-09-21 ENCOUNTER — Encounter
Admission: RE | Admit: 2016-09-21 | Discharge: 2016-09-21 | Disposition: A | Discharge status: Home / Self Care | Payer: BLUE CROSS/BLUE SHIELD | Source: Ambulatory Visit | Attending: Surgery | Admitting: Surgery

## 2016-09-21 DIAGNOSIS — E739 Lactose intolerance, unspecified: Secondary | ICD-10-CM | POA: Diagnosis not present

## 2016-09-21 DIAGNOSIS — Z8711 Personal history of peptic ulcer disease: Secondary | ICD-10-CM | POA: Diagnosis not present

## 2016-09-21 DIAGNOSIS — F17210 Nicotine dependence, cigarettes, uncomplicated: Secondary | ICD-10-CM | POA: Diagnosis not present

## 2016-09-21 DIAGNOSIS — S82142A Displaced bicondylar fracture of left tibia, initial encounter for closed fracture: Secondary | ICD-10-CM | POA: Diagnosis present

## 2016-09-21 DIAGNOSIS — Y92414 Local residential or business street as the place of occurrence of the external cause: Secondary | ICD-10-CM | POA: Diagnosis not present

## 2016-09-21 DIAGNOSIS — Z79899 Other long term (current) drug therapy: Secondary | ICD-10-CM | POA: Diagnosis not present

## 2016-09-21 DIAGNOSIS — Y998 Other external cause status: Secondary | ICD-10-CM | POA: Diagnosis not present

## 2016-09-21 DIAGNOSIS — R109 Unspecified abdominal pain: Secondary | ICD-10-CM | POA: Diagnosis not present

## 2016-09-21 DIAGNOSIS — Y9389 Activity, other specified: Secondary | ICD-10-CM | POA: Diagnosis not present

## 2016-09-21 DIAGNOSIS — R079 Chest pain, unspecified: Secondary | ICD-10-CM | POA: Diagnosis not present

## 2016-09-21 LAB — SURGICAL PCR SCREEN
MRSA, PCR: NEGATIVE
Staphylococcus aureus: NEGATIVE

## 2016-09-21 LAB — POTASSIUM: POTASSIUM: 4.2 mmol/L (ref 3.5–5.1)

## 2016-09-21 NOTE — Pre-Procedure Instructions (Signed)
EKG      EKG Interpretation  Date/Time:                  Wednesday September 14 2016 20:27:12 EDT Ventricular Rate:   71 PR Interval:                        QRS Duration:        106 QT Interval:                      373 QTC Calculation:    406 R Axis:                         24 Text Interpretation:  Sinus rhythm RSR' in V1 or V2, right VCD or RVH No significant change since last tracing Confirmed by YAO  MD, DAVID ([id number]) on 09/14/2016 10:11:58 PM

## 2016-09-22 ENCOUNTER — Ambulatory Visit: Payer: BLUE CROSS/BLUE SHIELD | Admitting: Registered Nurse

## 2016-09-22 ENCOUNTER — Ambulatory Visit
Admission: RE | Admit: 2016-09-22 | Discharge: 2016-09-22 | Disposition: A | Discharge status: Home / Self Care | Payer: BLUE CROSS/BLUE SHIELD | Source: Ambulatory Visit | Attending: Surgery | Admitting: Surgery

## 2016-09-22 ENCOUNTER — Encounter
Admission: RE | Disposition: A | Discharge status: Home / Self Care | Payer: Self-pay | Source: Ambulatory Visit | Attending: Surgery

## 2016-09-22 ENCOUNTER — Ambulatory Visit: Payer: BLUE CROSS/BLUE SHIELD

## 2016-09-22 DIAGNOSIS — S82142A Displaced bicondylar fracture of left tibia, initial encounter for closed fracture: Secondary | ICD-10-CM | POA: Diagnosis not present

## 2016-09-22 DIAGNOSIS — Y9389 Activity, other specified: Secondary | ICD-10-CM | POA: Insufficient documentation

## 2016-09-22 DIAGNOSIS — R079 Chest pain, unspecified: Secondary | ICD-10-CM | POA: Insufficient documentation

## 2016-09-22 DIAGNOSIS — Z79899 Other long term (current) drug therapy: Secondary | ICD-10-CM | POA: Insufficient documentation

## 2016-09-22 DIAGNOSIS — Y998 Other external cause status: Secondary | ICD-10-CM | POA: Insufficient documentation

## 2016-09-22 DIAGNOSIS — F17210 Nicotine dependence, cigarettes, uncomplicated: Secondary | ICD-10-CM | POA: Insufficient documentation

## 2016-09-22 DIAGNOSIS — Z8711 Personal history of peptic ulcer disease: Secondary | ICD-10-CM | POA: Insufficient documentation

## 2016-09-22 DIAGNOSIS — Y92414 Local residential or business street as the place of occurrence of the external cause: Secondary | ICD-10-CM | POA: Insufficient documentation

## 2016-09-22 DIAGNOSIS — Z419 Encounter for procedure for purposes other than remedying health state, unspecified: Secondary | ICD-10-CM

## 2016-09-22 DIAGNOSIS — R109 Unspecified abdominal pain: Secondary | ICD-10-CM | POA: Insufficient documentation

## 2016-09-22 DIAGNOSIS — E739 Lactose intolerance, unspecified: Secondary | ICD-10-CM | POA: Insufficient documentation

## 2016-09-22 HISTORY — PX: ORIF TIBIA PLATEAU: SHX2132

## 2016-09-22 HISTORY — PX: KNEE ARTHROSCOPY: SHX127

## 2016-09-22 LAB — URINE DRUG SCREEN, QUALITATIVE (ARMC ONLY)
Amphetamines, Ur Screen: NOT DETECTED
Barbiturates, Ur Screen: NOT DETECTED
Benzodiazepine, Ur Scrn: NOT DETECTED
CANNABINOID 50 NG, UR ~~LOC~~: POSITIVE — AB
COCAINE METABOLITE, UR ~~LOC~~: NOT DETECTED
MDMA (Ecstasy)Ur Screen: NOT DETECTED
Methadone Scn, Ur: NOT DETECTED
OPIATE, UR SCREEN: NOT DETECTED
PHENCYCLIDINE (PCP) UR S: NOT DETECTED
Tricyclic, Ur Screen: NOT DETECTED

## 2016-09-22 SURGERY — ARTHROSCOPY, KNEE
Anesthesia: General | Site: Knee | Laterality: Left | Wound class: Clean

## 2016-09-22 MED ORDER — FAMOTIDINE 20 MG PO TABS
ORAL_TABLET | ORAL | Status: AC
  Administered 2016-09-22: 20 mg via ORAL
  Filled 2016-09-22: qty 1

## 2016-09-22 MED ORDER — FENTANYL CITRATE (PF) 250 MCG/5ML IJ SOLN
INTRAMUSCULAR | Status: AC
  Filled 2016-09-22: qty 5

## 2016-09-22 MED ORDER — LIDOCAINE HCL 1 % IJ SOLN
INTRAMUSCULAR | Status: DC | PRN
  Administered 2016-09-22: 30 mL

## 2016-09-22 MED ORDER — LACTATED RINGERS IV SOLN
INTRAVENOUS | Status: DC
  Administered 2016-09-22 (×2): 1000 mL via INTRAVENOUS

## 2016-09-22 MED ORDER — DEXAMETHASONE SODIUM PHOSPHATE 10 MG/ML IJ SOLN
INTRAMUSCULAR | Status: AC
  Filled 2016-09-22: qty 1

## 2016-09-22 MED ORDER — FENTANYL CITRATE (PF) 100 MCG/2ML IJ SOLN
25.0000 ug | INTRAMUSCULAR | Status: AC | PRN
  Administered 2016-09-22 (×6): 25 ug via INTRAVENOUS

## 2016-09-22 MED ORDER — HYDROMORPHONE HCL 1 MG/ML IJ SOLN
INTRAMUSCULAR | Status: AC
  Administered 2016-09-22: 0.25 mg via INTRAVENOUS
  Filled 2016-09-22: qty 1

## 2016-09-22 MED ORDER — HYDROMORPHONE HCL 1 MG/ML IJ SOLN
0.2500 mg | INTRAMUSCULAR | Status: AC | PRN
  Administered 2016-09-22 (×8): 0.25 mg via INTRAVENOUS

## 2016-09-22 MED ORDER — PROPOFOL 10 MG/ML IV BOLUS
INTRAVENOUS | Status: DC | PRN
  Administered 2016-09-22: 200 mg via INTRAVENOUS

## 2016-09-22 MED ORDER — BUPIVACAINE-EPINEPHRINE (PF) 0.5% -1:200000 IJ SOLN
INTRAMUSCULAR | Status: AC
  Filled 2016-09-22: qty 30

## 2016-09-22 MED ORDER — HYDROMORPHONE HCL 1 MG/ML IJ SOLN
0.5000 mg | INTRAMUSCULAR | Status: DC | PRN
  Administered 2016-09-22 (×3): 0.5 mg via INTRAVENOUS

## 2016-09-22 MED ORDER — LIDOCAINE HCL (CARDIAC) 20 MG/ML IV SOLN
INTRAVENOUS | Status: DC | PRN
  Administered 2016-09-22: 60 mg via INTRAVENOUS

## 2016-09-22 MED ORDER — ACETAMINOPHEN 10 MG/ML IV SOLN
INTRAVENOUS | Status: DC | PRN
  Administered 2016-09-22: 1000 mg via INTRAVENOUS

## 2016-09-22 MED ORDER — FAMOTIDINE 20 MG PO TABS
20.0000 mg | ORAL_TABLET | Freq: Once | ORAL | Status: AC
  Administered 2016-09-22: 20 mg via ORAL

## 2016-09-22 MED ORDER — ONDANSETRON HCL 4 MG/2ML IJ SOLN
INTRAMUSCULAR | Status: DC | PRN
  Administered 2016-09-22: 4 mg via INTRAVENOUS

## 2016-09-22 MED ORDER — OXYCODONE HCL 5 MG PO TABS
ORAL_TABLET | ORAL | Status: AC
  Filled 2016-09-22: qty 2

## 2016-09-22 MED ORDER — FENTANYL CITRATE (PF) 100 MCG/2ML IJ SOLN
INTRAMUSCULAR | Status: AC
  Filled 2016-09-22: qty 2

## 2016-09-22 MED ORDER — KETOROLAC TROMETHAMINE 30 MG/ML IJ SOLN
INTRAMUSCULAR | Status: AC
  Administered 2016-09-22: 30 mg via INTRAVENOUS
  Filled 2016-09-22: qty 1

## 2016-09-22 MED ORDER — OXYCODONE HCL 5 MG PO TABS
10.0000 mg | ORAL_TABLET | ORAL | Status: DC | PRN
  Administered 2016-09-22: 10 mg via ORAL

## 2016-09-22 MED ORDER — FENTANYL CITRATE (PF) 100 MCG/2ML IJ SOLN
25.0000 ug | INTRAMUSCULAR | Status: DC | PRN
  Administered 2016-09-22: 25 ug via INTRAVENOUS

## 2016-09-22 MED ORDER — LIDOCAINE HCL (PF) 1 % IJ SOLN
INTRAMUSCULAR | Status: AC
  Filled 2016-09-22: qty 30

## 2016-09-22 MED ORDER — ONDANSETRON HCL 4 MG/2ML IJ SOLN: 4.0000 mg | Freq: Once | INTRAMUSCULAR | Status: DC | PRN

## 2016-09-22 MED ORDER — HYDROMORPHONE HCL 1 MG/ML IJ SOLN
INTRAMUSCULAR | Status: AC
  Administered 2016-09-22: 0.5 mg via INTRAVENOUS
  Filled 2016-09-22: qty 1

## 2016-09-22 MED ORDER — ACETAMINOPHEN 10 MG/ML IV SOLN
INTRAVENOUS | Status: AC
  Filled 2016-09-22: qty 100

## 2016-09-22 MED ORDER — BUPIVACAINE HCL (PF) 0.5 % IJ SOLN
INTRAMUSCULAR | Status: AC
  Filled 2016-09-22: qty 30

## 2016-09-22 MED ORDER — PROPOFOL 10 MG/ML IV BOLUS
INTRAVENOUS | Status: AC
  Filled 2016-09-22: qty 20

## 2016-09-22 MED ORDER — SUGAMMADEX SODIUM 200 MG/2ML IV SOLN
INTRAVENOUS | Status: AC
  Filled 2016-09-22: qty 2

## 2016-09-22 MED ORDER — MIDAZOLAM HCL 2 MG/2ML IJ SOLN
INTRAMUSCULAR | Status: DC | PRN
  Administered 2016-09-22: 2 mg via INTRAVENOUS

## 2016-09-22 MED ORDER — LIDOCAINE HCL (PF) 2 % IJ SOLN
INTRAMUSCULAR | Status: AC
  Filled 2016-09-22: qty 2

## 2016-09-22 MED ORDER — FENTANYL CITRATE (PF) 100 MCG/2ML IJ SOLN
INTRAMUSCULAR | Status: AC
  Administered 2016-09-22: 25 ug via INTRAVENOUS
  Filled 2016-09-22: qty 2

## 2016-09-22 MED ORDER — ONDANSETRON HCL 4 MG/2ML IJ SOLN
INTRAMUSCULAR | Status: AC
  Filled 2016-09-22: qty 2

## 2016-09-22 MED ORDER — NEOMYCIN-POLYMYXIN B GU 40-200000 IR SOLN
Status: AC
  Filled 2016-09-22: qty 20

## 2016-09-22 MED ORDER — MIDAZOLAM HCL 2 MG/2ML IJ SOLN
INTRAMUSCULAR | Status: AC
  Filled 2016-09-22: qty 2

## 2016-09-22 MED ORDER — OXYCODONE HCL 5 MG PO TABS: 5.0000 mg | ORAL_TABLET | ORAL | 0 refills | Status: DC | PRN

## 2016-09-22 MED ORDER — ROCURONIUM BROMIDE 50 MG/5ML IV SOLN
INTRAVENOUS | Status: AC
  Filled 2016-09-22: qty 1

## 2016-09-22 MED ORDER — CLINDAMYCIN PHOSPHATE 900 MG/50ML IV SOLN
900.0000 mg | Freq: Once | INTRAVENOUS | Status: AC
  Administered 2016-09-22: 900 mg via INTRAVENOUS

## 2016-09-22 MED ORDER — SUGAMMADEX SODIUM 200 MG/2ML IV SOLN
INTRAVENOUS | Status: DC | PRN
Start: 2016-09-22 — End: 2016-09-22
  Administered 2016-09-22: 200 mg via INTRAVENOUS

## 2016-09-22 MED ORDER — KETOROLAC TROMETHAMINE 30 MG/ML IJ SOLN
30.0000 mg | Freq: Once | INTRAMUSCULAR | Status: AC
  Administered 2016-09-22: 30 mg via INTRAVENOUS

## 2016-09-22 MED ORDER — CLINDAMYCIN PHOSPHATE 900 MG/50ML IV SOLN
INTRAVENOUS | Status: AC
  Filled 2016-09-22: qty 50

## 2016-09-22 MED ORDER — SUCCINYLCHOLINE CHLORIDE 20 MG/ML IJ SOLN
INTRAMUSCULAR | Status: AC
  Filled 2016-09-22: qty 1

## 2016-09-22 MED ORDER — FENTANYL CITRATE (PF) 100 MCG/2ML IJ SOLN
INTRAMUSCULAR | Status: DC | PRN
  Administered 2016-09-22 (×2): 25 ug via INTRAVENOUS
  Administered 2016-09-22: 50 ug via INTRAVENOUS
  Administered 2016-09-22: 100 ug via INTRAVENOUS
  Administered 2016-09-22: 25 ug via INTRAVENOUS
  Administered 2016-09-22 (×2): 50 ug via INTRAVENOUS
  Administered 2016-09-22: 25 ug via INTRAVENOUS
  Administered 2016-09-22: 100 ug via INTRAVENOUS

## 2016-09-22 MED ORDER — ROCURONIUM BROMIDE 100 MG/10ML IV SOLN
INTRAVENOUS | Status: DC | PRN
  Administered 2016-09-22: 40 mg via INTRAVENOUS
  Administered 2016-09-22: 20 mg via INTRAVENOUS
  Administered 2016-09-22: 10 mg via INTRAVENOUS

## 2016-09-22 MED ORDER — BUPIVACAINE-EPINEPHRINE (PF) 0.5% -1:200000 IJ SOLN
INTRAMUSCULAR | Status: DC | PRN
  Administered 2016-09-22 (×2): 30 mL via PERINEURAL

## 2016-09-22 SURGICAL SUPPLY — 68 items
BAG COUNTER SPONGE EZ (MISCELLANEOUS) IMPLANT
BANDAGE ACE 6X5 VEL STRL LF (GAUZE/BANDAGES/DRESSINGS) ×3 IMPLANT
BIT DRILL 4.8X200 CANN (BIT) ×3 IMPLANT
BLADE FULL RADIUS 3.5 (BLADE) ×3 IMPLANT
BLADE SHAVER 4.5X7 STR FR (MISCELLANEOUS) ×3 IMPLANT
BLADE SURG 15 STRL LF DISP TIS (BLADE) ×1 IMPLANT
BLADE SURG 15 STRL SS (BLADE) ×2
BNDG COHESIVE 4X5 TAN STRL (GAUZE/BANDAGES/DRESSINGS) ×3 IMPLANT
BNDG ESMARK 6X12 TAN STRL LF (GAUZE/BANDAGES/DRESSINGS) ×3 IMPLANT
BRACE KNEE POST OP SHORT (BRACE) ×3 IMPLANT
CANISTER SUCT 1200ML W/VALVE (MISCELLANEOUS) ×3 IMPLANT
CATH TRAY 16F METER LATEX (MISCELLANEOUS) IMPLANT
CHLORAPREP W/TINT 26ML (MISCELLANEOUS) ×3 IMPLANT
COOLER POLAR GLACIER W/PUMP (MISCELLANEOUS) ×3 IMPLANT
COUNTER SPONGE BAG EZ (MISCELLANEOUS)
CUFF TOURN 24 STER (MISCELLANEOUS) IMPLANT
CUFF TOURN 30 STER DUAL PORT (MISCELLANEOUS) IMPLANT
CUFF TOURN 34 STER (MISCELLANEOUS) ×3 IMPLANT
DRAPE C-ARM XRAY 36X54 (DRAPES) IMPLANT
DRAPE C-ARMOR (DRAPES) IMPLANT
DRAPE IMP U-DRAPE 54X76 (DRAPES) ×3 IMPLANT
DRAPE INCISE IOBAN 66X45 STRL (DRAPES) ×3 IMPLANT
DRAPE SHEET LG 3/4 BI-LAMINATE (DRAPES) ×3 IMPLANT
DRAPE TABLE BACK 80X90 (DRAPES) ×3 IMPLANT
ELECT CAUTERY BLADE 6.4 (BLADE) ×3 IMPLANT
ELECT REM PT RETURN 9FT ADLT (ELECTROSURGICAL) ×3
ELECTRODE REM PT RTRN 9FT ADLT (ELECTROSURGICAL) ×1 IMPLANT
GAUZE PETRO XEROFOAM 1X8 (MISCELLANEOUS) ×3 IMPLANT
GAUZE SPONGE 4X4 12PLY STRL (GAUZE/BANDAGES/DRESSINGS) ×3 IMPLANT
GLOVE BIO SURGEON STRL SZ8 (GLOVE) ×6 IMPLANT
GLOVE BIOGEL M 7.0 STRL (GLOVE) ×6 IMPLANT
GLOVE BIOGEL PI IND STRL 7.5 (GLOVE) ×1 IMPLANT
GLOVE BIOGEL PI INDICATOR 7.5 (GLOVE) ×2
GLOVE INDICATOR 8.0 STRL GRN (GLOVE) ×3 IMPLANT
GOWN STRL REUS W/ TWL LRG LVL3 (GOWN DISPOSABLE) ×1 IMPLANT
GOWN STRL REUS W/ TWL XL LVL3 (GOWN DISPOSABLE) ×2 IMPLANT
GOWN STRL REUS W/TWL LRG LVL3 (GOWN DISPOSABLE) ×2
GOWN STRL REUS W/TWL XL LVL3 (GOWN DISPOSABLE) ×4
IV LACTATED RINGER IRRG 3000ML (IV SOLUTION) ×4
IV LR IRRIG 3000ML ARTHROMATIC (IV SOLUTION) ×2 IMPLANT
KIT RM TURNOVER STRD PROC AR (KITS) ×3 IMPLANT
MANIFOLD NEPTUNE II (INSTRUMENTS) ×3 IMPLANT
NEEDLE HYPO 21X1.5 SAFETY (NEEDLE) ×3 IMPLANT
NS IRRIG 1000ML POUR BTL (IV SOLUTION) ×3 IMPLANT
PACK ARTHROSCOPY KNEE (MISCELLANEOUS) ×3 IMPLANT
PACK TOTAL KNEE (MISCELLANEOUS) ×3 IMPLANT
PAD ABD DERMACEA PRESS 5X9 (GAUZE/BANDAGES/DRESSINGS) ×6 IMPLANT
PAD CAST CTTN 4X4 STRL (SOFTGOODS) IMPLANT
PAD WRAPON POLAR KNEE (MISCELLANEOUS) ×1 IMPLANT
PADDING CAST COTTON 4X4 STRL (SOFTGOODS)
PENCIL ELECTRO HAND CTR (MISCELLANEOUS) ×3 IMPLANT
PIN GUIDE DRILL TIP 2.8X300 (DRILL) ×6 IMPLANT
SCREW CANN 80X40X6.5 NS (Screw) ×1 IMPLANT
SCREW CANNULATED 6.5X75MM (Screw) ×3 IMPLANT
SCREW CANNULATED 6.5X80MM (Screw) ×2 IMPLANT
STAPLER SKIN PROX 35W (STAPLE) ×3 IMPLANT
STOCKINETTE M/LG 89821 (MISCELLANEOUS) ×3 IMPLANT
SUT PROLENE 4 0 PS 2 18 (SUTURE) ×6 IMPLANT
SUT TICRON COATED BLUE 2 0 30 (SUTURE) IMPLANT
SUT VIC AB 0 CT1 36 (SUTURE) ×3 IMPLANT
SUT VIC AB 1 CT1 36 (SUTURE) ×3 IMPLANT
SYR 50ML LL SCALE MARK (SYRINGE) ×3 IMPLANT
TUBING ARTHRO INFLOW-ONLY STRL (TUBING) ×3 IMPLANT
TUBING CONNECTING 10 (TUBING) ×2 IMPLANT
TUBING CONNECTING 10' (TUBING) ×1
WAND HAND CNTRL MULTIVAC 90 (MISCELLANEOUS) ×3 IMPLANT
WASHER FLAT 6.5MM (Washer) ×3 IMPLANT
WRAPON POLAR PAD KNEE (MISCELLANEOUS) ×3

## 2016-09-22 NOTE — Anesthesia Postprocedure Evaluation (Addendum)
Anesthesia Post Note  Patient: MENELIK MCFARREN  Procedure(s) Performed: Procedure(s) (LRB): ARTHROSCOPY KNEE (Left) OPEN REDUCTION INTERNAL FIXATION (ORIF) TIBIAL PLATEAU (Left)  Patient location during evaluation: PACU Anesthesia Type: General Level of consciousness: awake and alert and oriented Vital Signs Assessment: post-procedure vital signs reviewed and stable Respiratory status: spontaneous breathing Cardiovascular status: blood pressure returned to baseline Anesthetic complications: no Comments: Patient required increased levels of pain meds post-op.  Patient was not consented for post op pain block preoperatively and none was requested by surgery until after case concluded and only if he needed it.  May need overnight stay for pain control.  Will continue with pain meds to make comfortable.     1315... Patient states pain level improving to 6-7/10 and eating crackers after sleeping.   1345.Marland KitchenMarland Kitchenpain  level much better at 5/10...  Will  Go with po meds      Last Vitals:  Vitals:   09/22/16 1200 09/22/16 1209  BP:    Pulse: 71 80  Resp: 17 16  Temp:      Last Pain:  Vitals:   09/22/16 1209  TempSrc:   PainSc: Asleep                 Matheau Orona

## 2016-09-22 NOTE — Transfer of Care (Signed)
Immediate Anesthesia Transfer of Care Note  Patient: Wesley Hart  Procedure(s) Performed: Procedure(s): ARTHROSCOPY KNEE (Left) OPEN REDUCTION INTERNAL FIXATION (ORIF) TIBIAL PLATEAU (Left)  Patient Location: PACU  Anesthesia Type:General  Level of Consciousness: awake, alert  and oriented  Airway & Oxygen Therapy: Patient Spontanous Breathing and Patient connected to face mask oxygen  Post-op Assessment: Report given to RN and Post -op Vital signs reviewed and stable  Post vital signs: Reviewed and stable  Last Vitals:  Vitals:   09/22/16 0616 09/22/16 1035  BP: (!) 147/77 (!) 171/80  Pulse: (!) 59 79  Resp: 12 15  Temp: 36.6 C 36.4 C    Last Pain:  Vitals:   09/22/16 0616  TempSrc: Tympanic  PainSc: 8          Complications: No apparent anesthesia complications

## 2016-09-22 NOTE — Anesthesia Preprocedure Evaluation (Signed)
Anesthesia Evaluation  Patient identified by MRN, date of birth, ID band Patient awake    Reviewed: Allergy & Precautions, NPO status , Patient's Chart, lab work & pertinent test results  Airway Mallampati: II       Dental   Pulmonary Current Smoker,    Pulmonary exam normal        Cardiovascular negative cardio ROS Normal cardiovascular exam     Neuro/Psych negative neurological ROS  negative psych ROS   GI/Hepatic Neg liver ROS, PUD,   Endo/Other  negative endocrine ROS  Renal/GU negative Renal ROS  negative genitourinary   Musculoskeletal negative musculoskeletal ROS (+)   Abdominal Normal abdominal exam  (+)   Peds negative pediatric ROS (+)  Hematology negative hematology ROS (+)   Anesthesia Other Findings   Reproductive/Obstetrics                             Anesthesia Physical Anesthesia Plan  ASA: II  Anesthesia Plan: General   Post-op Pain Management:    Induction: Intravenous  Airway Management Planned: Oral ETT  Additional Equipment:   Intra-op Plan:   Post-operative Plan: Extubation in OR  Informed Consent: I have reviewed the patients History and Physical, chart, labs and discussed the procedure including the risks, benefits and alternatives for the proposed anesthesia with the patient or authorized representative who has indicated his/her understanding and acceptance.     Plan Discussed with: CRNA and Surgeon  Anesthesia Plan Comments:         Anesthesia Quick Evaluation

## 2016-09-22 NOTE — Addendum Note (Signed)
Addendum  created 09/22/16 1359 by Alvin Critchley, MD   Sign clinical note
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
## 2016-09-22 NOTE — Anesthesia Post-op Follow-up Note (Cosign Needed)
Anesthesia QCDR form completed.        

## 2016-09-22 NOTE — Discharge Instructions (Addendum)
Keep dressing dry and intact.  May shower after dressing changed on post-op day #4 (Monday).  Cover staples with Band-Aids after drying off. Apply ice frequently to knee or use Polar Care. Take ibuprofen 800 mg TID with meals for 7-10 days, then as necessary. Take oxycodone as prescribed when needed.  May supplement with ES Tylenol if necessary. No weight-bearing on left leg. Use crutches. Keep brace on at all times except may remove for bathing purposes. Follow-up in 10-14 days or as scheduled.  AMBULATORY SURGERY  DISCHARGE INSTRUCTIONS   1) The drugs that you were given will stay in your system until tomorrow so for the next 24 hours you should not:  A) Drive an automobile B) Make any legal decisions C) Drink any alcoholic beverage   2) You may resume regular meals tomorrow.  Today it is better to start with liquids and gradually work up to solid foods.  You may eat anything you prefer, but it is better to start with liquids, then soup and crackers, and gradually work up to solid foods.   3) Please notify your doctor immediately if you have any unusual bleeding, trouble breathing, redness and pain at the surgery site, drainage, fever, or pain not relieved by medication.    4) Additional Instructions:        Please contact your physician with any problems or Same Day Surgery at [phone], Monday through Friday 6 am to 4 pm, or [place] at [facility] number at [phone].

## 2016-09-22 NOTE — H&P (Signed)
Paper H&P to be scanned into permanent record. H&P reviewed and patient re-examined. No changes. 

## 2016-09-22 NOTE — Anesthesia Procedure Notes (Signed)
Procedure Name: Intubation Date/Time: 09/22/2016 7:44 AM Performed by: Hedda Slade Pre-anesthesia Checklist: Patient identified, Patient being monitored, Timeout performed, Emergency Drugs available and Suction available Patient Re-evaluated:Patient Re-evaluated prior to inductionOxygen Delivery Method: Circle system utilized Preoxygenation: Pre-oxygenation with 100% oxygen Intubation Type: IV induction Ventilation: Mask ventilation without difficulty Laryngoscope Size: Mac and 4 Grade View: Grade I Tube type: Oral Tube size: 7.5 mm Number of attempts: 1 Airway Equipment and Method: Stylet Placement Confirmation: ETT inserted through vocal cords under direct vision,  positive ETCO2 and breath sounds checked- equal and bilateral Secured at: 23 cm Tube secured with: Tape Dental Injury: Teeth and Oropharynx as per pre-operative assessment

## 2017-02-28 ENCOUNTER — Emergency Department: Payer: BLUE CROSS/BLUE SHIELD

## 2017-02-28 ENCOUNTER — Emergency Department
Admission: EM | Admit: 2017-02-28 | Discharge: 2017-02-28 | Disposition: A | Discharge status: Home / Self Care | Payer: BLUE CROSS/BLUE SHIELD | Attending: Emergency Medicine | Admitting: Emergency Medicine

## 2017-02-28 DIAGNOSIS — F17210 Nicotine dependence, cigarettes, uncomplicated: Secondary | ICD-10-CM | POA: Insufficient documentation

## 2017-02-28 DIAGNOSIS — M25562 Pain in left knee: Secondary | ICD-10-CM | POA: Diagnosis not present

## 2017-02-28 DIAGNOSIS — W19XXXA Unspecified fall, initial encounter: Secondary | ICD-10-CM

## 2017-02-28 MED ORDER — NAPROXEN 500 MG PO TABS: 500.0000 mg | ORAL_TABLET | Freq: Two times a day (BID) | ORAL | 2 refills | Status: DC

## 2017-02-28 MED ORDER — KETOROLAC TROMETHAMINE 30 MG/ML IJ SOLN
30.0000 mg | Freq: Once | INTRAMUSCULAR | Status: AC
  Administered 2017-02-28: 30 mg via INTRAMUSCULAR

## 2017-02-28 MED ORDER — ONDANSETRON 4 MG PO TBDP
ORAL_TABLET | ORAL | Status: AC
  Filled 2017-02-28: qty 1

## 2017-02-28 MED ORDER — ONDANSETRON 4 MG PO TBDP
4.0000 mg | ORAL_TABLET | Freq: Once | ORAL | Status: AC
  Administered 2017-02-28: 4 mg via ORAL

## 2017-02-28 MED ORDER — KETOROLAC TROMETHAMINE 30 MG/ML IJ SOLN
INTRAMUSCULAR | Status: AC
  Filled 2017-02-28: qty 1

## 2017-02-28 NOTE — ED Provider Notes (Signed)
[facility] Emergency Department Provider Note   ____________________________________________    I have reviewed the triage vital signs and the nursing notes.   HISTORY  Chief Complaint Knee Pain     HPI Wesley Hart is a 31 y.o. male Who presents with complaints of knee pain. Patient reports prior surgery in his left knee. Patient reports his leg gave out on him while he was working and he fell onto his left knee. He complains of pain primarily along thelateral aspect of his knee. No bony abnormality is reported. He is able to bear weight. He reports severe pain   Past Medical History:  Diagnosis Date  . Lactose intolerance   . Stomach ulcer     There are no active problems to display for this patient.   Past Surgical History:  Procedure Laterality Date  . FRACTURE SURGERY  2005  . KNEE ARTHROSCOPY Left 09/22/2016   Procedure: ARTHROSCOPY KNEE;  Surgeon: Corky Mull, MD;  Location: [facility] ORS;  Service: Orthopedics;  Laterality: Left;  . ORIF TIBIA PLATEAU Left 09/22/2016   Procedure: OPEN REDUCTION INTERNAL FIXATION (ORIF) TIBIAL PLATEAU;  Surgeon: Corky Mull, MD;  Location: [facility] ORS;  Service: Orthopedics;  Laterality: Left;    Prior to Admission medications   Medication Sig Start Date End Date Taking? Authorizing Provider  ibuprofen (ADVIL,MOTRIN) 200 MG tablet Take 400 mg by mouth 2 (two) times daily as needed for headache or moderate pain.    [provider]  meclizine (ANTIVERT) 25 MG tablet Take 1 tablet (25 mg total) by mouth 3 (three) times daily as needed for dizziness. 08/21/16   Loney Hering, MD  naproxen (NAPROSYN) 500 MG tablet Take 1 tablet (500 mg total) by mouth 2 (two) times daily with a meal. 02/28/17   Lavonia Drafts, MD  oxyCODONE (ROXICODONE) 5 MG immediate release tablet Take 1-2 tablets (5-10 mg total) by mouth every 4 (four) hours as needed for severe pain. 09/22/16   Poggi, Marshall Cork, MD      Allergies Mushroom extract complex; Lactose intolerance (gi); Penicillins; and Prednisone  No family history on file.  Social History Social History  Substance Use Topics  . Smoking status: Current Every Day Smoker    Packs/day: 0.25    Years: 18.00    Types: Cigarettes  . Smokeless tobacco: Never Used  . Alcohol use Yes     Comment: 1-2 BEERS weekly    Review of Systems Constitutional: Nodizziness       Musculoskeletal: knee pain as above Skin: no abrasion or rash Neurological: Negative for focal deficits    ____________________________________________   PHYSICAL EXAM:  VITAL SIGNS: ED Triage Vitals  Enc Vitals Group     BP 02/28/17 0029 (!) 167/90     Pulse Rate 02/28/17 0029 87     Resp 02/28/17 0029 18     Temp 02/28/17 0029 98 F (36.7 C)     Temp src --      SpO2 02/28/17 0029 97 %     Weight 02/28/17 0028 106.6 kg (235 lb)     Height 02/28/17 0028 1.803 m (5\' 11" )     Head Circumference --      Peak Flow --      Pain Score 02/28/17 0028 10     Pain Loc --      Pain Edu? --      Excl. in [place]? --     Constitutional: Alert and oriented.  No acute distress. Pleasant and interactive Eyes: Conjunctivae are normal.   Nose: No congestion/rhinnorhea. Mouth/Throat: Mucous membranes are moist.   Cardiovascular: Normal rate, regular rhythm.  Respiratory: Normal respiratory effort.  No retractions. Genitourinary: deferred Musculoskeletal: left knee, mild swelling and some tenderness to palpation along the lateral aspect, no bony abnormalities felt. 2+ distal pulses. Able to bear weight Neurologic:  Normal speech and language. No gross focal neurologic deficits are appreciated.   Skin:  Skin is warm, dry and intact. No rash noted.   ____________________________________________   LABS (all labs ordered are listed, but only abnormal results are displayed)  Labs Reviewed - No data to  display ____________________________________________  EKG   ____________________________________________  RADIOLOGY  x-ray of the knee no acute fracture ____________________________________________   PROCEDURES  Procedure(s) performed:No    Critical Care performed: No ____________________________________________   INITIAL IMPRESSION / ASSESSMENT AND PLAN / ED COURSE  Pertinent labs & imaging results that were available during my care of the patient were reviewed by me and considered in my medical decision making (see chart for details).  patient with chronic left knee pain, now with fall and acute on chronic pain. We will give a shot of Toradol IM. X-rays are reassuring. We will place on crutches and have him follow-up with orthopedics   ____________________________________________   FINAL CLINICAL IMPRESSION(S) / ED DIAGNOSES  Final diagnoses:  Acute pain of left knee  Fall, initial encounter      NEW MEDICATIONS STARTED DURING THIS VISIT:  New Prescriptions   NAPROXEN (NAPROSYN) 500 MG TABLET    Take 1 tablet (500 mg total) by mouth 2 (two) times daily with a meal.     Note:  This document was prepared using Dragon voice recognition software and may include unintentional dictation errors.    Lavonia Drafts, MD 02/28/17 [phone]

## 2017-02-28 NOTE — ED Triage Notes (Signed)
Patient ambulatory to triage with steady gait, without difficulty or distress noted; pt reports left knee pain today; st arthroscopy in May; denies any specific injury but st knee has gave out few times today

## 2017-10-25 ENCOUNTER — Encounter: Payer: Self-pay | Admitting: Emergency Medicine

## 2017-10-25 ENCOUNTER — Emergency Department: Payer: Self-pay

## 2017-10-25 DIAGNOSIS — Y929 Unspecified place or not applicable: Secondary | ICD-10-CM | POA: Insufficient documentation

## 2017-10-25 DIAGNOSIS — X509XXA Other and unspecified overexertion or strenuous movements or postures, initial encounter: Secondary | ICD-10-CM | POA: Insufficient documentation

## 2017-10-25 DIAGNOSIS — S46911A Strain of unspecified muscle, fascia and tendon at shoulder and upper arm level, right arm, initial encounter: Secondary | ICD-10-CM | POA: Insufficient documentation

## 2017-10-25 DIAGNOSIS — Y990 Civilian activity done for income or pay: Secondary | ICD-10-CM | POA: Insufficient documentation

## 2017-10-25 DIAGNOSIS — Y9389 Activity, other specified: Secondary | ICD-10-CM | POA: Insufficient documentation

## 2017-10-25 NOTE — ED Triage Notes (Signed)
Pt c/o right shoulder pain x2 weeks and tonight when pt picked son up the pain intensified and pt felt sharp pain in the shoulder. No obvious deformity noted.

## 2017-10-26 ENCOUNTER — Emergency Department
Admission: EM | Admit: 2017-10-26 | Discharge: 2017-10-26 | Disposition: A | Discharge status: Home / Self Care | Payer: Self-pay | Attending: Emergency Medicine | Admitting: Emergency Medicine

## 2017-10-26 DIAGNOSIS — S46911A Strain of unspecified muscle, fascia and tendon at shoulder and upper arm level, right arm, initial encounter: Secondary | ICD-10-CM

## 2017-10-26 DIAGNOSIS — M25511 Pain in right shoulder: Secondary | ICD-10-CM

## 2017-10-26 MED ORDER — CYCLOBENZAPRINE HCL 5 MG PO TABS: ORAL_TABLET | ORAL | 0 refills | Status: DC

## 2017-10-26 MED ORDER — OXYCODONE-ACETAMINOPHEN 5-325 MG PO TABS
1.0000 | ORAL_TABLET | Freq: Once | ORAL | Status: AC
  Administered 2017-10-26: 1 via ORAL
  Filled 2017-10-26: qty 1

## 2017-10-26 MED ORDER — IBUPROFEN 800 MG PO TABS: 800.0000 mg | ORAL_TABLET | Freq: Three times a day (TID) | ORAL | 0 refills | Status: DC | PRN

## 2017-10-26 MED ORDER — CYCLOBENZAPRINE HCL 10 MG PO TABS
5.0000 mg | ORAL_TABLET | Freq: Once | ORAL | Status: AC
  Administered 2017-10-26: 5 mg via ORAL
  Filled 2017-10-26: qty 1

## 2017-10-26 MED ORDER — KETOROLAC TROMETHAMINE 30 MG/ML IJ SOLN
30.0000 mg | Freq: Once | INTRAMUSCULAR | Status: AC
  Administered 2017-10-26: 30 mg via INTRAMUSCULAR
  Filled 2017-10-26: qty 1

## 2017-10-26 MED ORDER — OXYCODONE-ACETAMINOPHEN 5-325 MG PO TABS: 1.0000 | ORAL_TABLET | ORAL | 0 refills | Status: DC | PRN

## 2017-10-26 NOTE — ED Provider Notes (Signed)
[facility] Emergency Department Provider Note   ____________________________________________   First MD Initiated Contact with Patient 10/26/17 0041     (approximate)  I have reviewed the triage vital signs and the nursing notes.   HISTORY  Chief Complaint Shoulder Pain    HPI Wesley Hart is a 32 y.o. male who presents to the ED from home with a chief complaint of right shoulder pain.  Patient works as a Administrator; moves heavy bags daily at work.  Noted pain to his right shoulder for the past 2 weeks.  Tonight patient picked up his son and the pain intensified.  Patient is right-hand dominant.  Complains of pain to his right shoulder.  Denies associated extremity weakness, numbness or tingling.  Voices no other complaints.  Denies trauma or injury.   Past Medical History:  Diagnosis Date  . Lactose intolerance   . Stomach ulcer     There are no active problems to display for this patient.   Past Surgical History:  Procedure Laterality Date  . FRACTURE SURGERY  2005  . KNEE ARTHROSCOPY Left 09/22/2016   Procedure: ARTHROSCOPY KNEE;  Surgeon: Corky Mull, MD;  Location: [facility] ORS;  Service: Orthopedics;  Laterality: Left;  . ORIF TIBIA PLATEAU Left 09/22/2016   Procedure: OPEN REDUCTION INTERNAL FIXATION (ORIF) TIBIAL PLATEAU;  Surgeon: Corky Mull, MD;  Location: [facility] ORS;  Service: Orthopedics;  Laterality: Left;    Prior to Admission medications   Medication Sig Start Date End Date Taking? Authorizing Provider  cyclobenzaprine (FLEXERIL) 5 MG tablet 1 tablet every 8 hours as he did for muscle spasms 10/26/17   Paulette Blanch, MD  ibuprofen (ADVIL,MOTRIN) 800 MG tablet Take 1 tablet (800 mg total) by mouth every 8 (eight) hours as needed for moderate pain. 10/26/17   Paulette Blanch, MD  meclizine (ANTIVERT) 25 MG tablet Take 1 tablet (25 mg total) by mouth 3 (three) times daily as needed for dizziness. 08/21/16   Loney Hering, MD  naproxen  (NAPROSYN) 500 MG tablet Take 1 tablet (500 mg total) by mouth 2 (two) times daily with a meal. 02/28/17   Lavonia Drafts, MD  oxyCODONE (ROXICODONE) 5 MG immediate release tablet Take 1-2 tablets (5-10 mg total) by mouth every 4 (four) hours as needed for severe pain. 09/22/16   Poggi, Marshall Cork, MD  oxyCODONE-acetaminophen (PERCOCET/ROXICET) 5-325 MG tablet Take 1 tablet by mouth every 4 (four) hours as needed for severe pain. 10/26/17   Paulette Blanch, MD    Allergies Mushroom extract complex; Lactose intolerance (gi); Penicillins; and Prednisone  History reviewed. No pertinent family history.  Social History Social History   Tobacco Use  . Smoking status: Current Every Day Smoker    Packs/day: 0.25    Years: 18.00    Pack years: 4.50    Types: Cigarettes  . Smokeless tobacco: Never Used  Substance Use Topics  . Alcohol use: Yes    Comment: 1-2 BEERS weekly  . Drug use: Yes    Types: Marijuana    Comment: PT DENIES THIS DURING PHONE INTERVIEW (09-20-16)    Review of Systems  Constitutional: No fever/chills Eyes: No visual changes. ENT: No sore throat. Cardiovascular: Denies chest pain. Respiratory: Denies shortness of breath. Gastrointestinal: No abdominal pain.  No nausea, no vomiting.  No diarrhea.  No constipation. Genitourinary: Negative for dysuria. Musculoskeletal: Positive for right shoulder pain.  Negative for back pain. Skin: Negative for rash. Neurological: Negative for headaches,  focal weakness or numbness.   ____________________________________________   PHYSICAL EXAM:  VITAL SIGNS: ED Triage Vitals [10/25/17 2309]  Enc Vitals Group     BP (!) 156/82     Pulse Rate 95     Resp 17     Temp 98 F (36.7 C)     Temp Source Oral     SpO2 99 %     Weight 227 lb (103 kg)     Height      Head Circumference      Peak Flow      Pain Score 8     Pain Loc      Pain Edu?      Excl. in [place]?     Constitutional: Alert and oriented. Well appearing and in no acute  distress. Eyes: Conjunctivae are normal. PERRL. EOMI. Head: Atraumatic. Nose: No congestion/rhinnorhea. Mouth/Throat: Mucous membranes are moist.  Oropharynx non-erythematous. Neck: No stridor.  No cervical spine tenderness to palpation. Cardiovascular: Normal rate, regular rhythm. Grossly normal heart sounds.  Good peripheral circulation. Respiratory: Normal respiratory effort.  No retractions. Lungs CTAB. Gastrointestinal: Soft and nontender. No distention. No abdominal bruits. No CVA tenderness. Musculoskeletal: Right anterior shoulder tender to palpation.  Limited range of motion secondary to pain.  Muscle spasms noted.  2+ radial pulse.  Brisk, less than 5-second capillary refill.  5/5 motor strength and sensation. Neurologic:  Normal speech and language. No gross focal neurologic deficits are appreciated. No gait instability. Skin:  Skin is warm, dry and intact. No rash noted. Psychiatric: Mood and affect are normal. Speech and behavior are normal.  ____________________________________________   LABS (all labs ordered are listed, but only abnormal results are displayed)  Labs Reviewed - No data to display ____________________________________________  EKG  None ____________________________________________  RADIOLOGY  ED MD interpretation: No acute traumatic injury  Official radiology report(s): Dg Shoulder Right  Result Date: 10/25/2017 CLINICAL DATA:  Right shoulder pain. Pain for 2 weeks, acutely worsened today after picking up son. EXAM: RIGHT SHOULDER - 2+ VIEW COMPARISON:  None. FINDINGS: There is no evidence of fracture or dislocation. There is no evidence of arthropathy or other focal bone abnormality. Soft tissues are unremarkable. IMPRESSION: Negative radiographs of the right shoulder. Electronically Signed   By: Jeb Levering M.D.   On: 10/25/2017 23:39    ____________________________________________   PROCEDURES  Procedure(s) performed:  None  Procedures  Critical Care performed: No  ____________________________________________   INITIAL IMPRESSION / ASSESSMENT AND PLAN / ED COURSE  As part of my medical decision making, I reviewed the following data within the [place] History obtained from family, Nursing notes reviewed and incorporated, Radiograph reviewed and Notes from prior ED visits   32 year old male who presents with right shoulder strain.  Will treat with NSAIDs, analgesia and muscle relaxer.  Placed in sling and patient will follow-up with orthopedics as needed.  Strict return precautions given.  Patient and spouse verbalize understanding and agree with plan of care.      ____________________________________________   FINAL CLINICAL IMPRESSION(S) / ED DIAGNOSES  Final diagnoses:  Acute pain of right shoulder  Strain of right shoulder, initial encounter     ED Discharge Orders        Ordered    ibuprofen (ADVIL,MOTRIN) 800 MG tablet  Every 8 hours PRN     10/26/17 0047    cyclobenzaprine (FLEXERIL) 5 MG tablet     10/26/17 0047    oxyCODONE-acetaminophen (PERCOCET/ROXICET) 5-325 MG tablet  Every 4 hours PRN     10/26/17 0047       Note:  This document was prepared using Dragon voice recognition software and may include unintentional dictation errors.    Paulette Blanch, MD 10/26/17 [phone]

## 2017-10-26 NOTE — Discharge Instructions (Addendum)
1.  You may take medicines as needed for pain and muscle spasms (Motrin/Percocet/Flexeril #15). 2.  Wear sling as needed for comfort. 3.  Return to the ER for worsening symptoms, persistent vomiting, difficulty breathing or other concerns.

## 2017-12-14 ENCOUNTER — Emergency Department
Admission: EM | Admit: 2017-12-14 | Discharge: 2017-12-14 | Disposition: A | Discharge status: Home / Self Care | Payer: Medicaid Other | Attending: Emergency Medicine | Admitting: Emergency Medicine

## 2017-12-14 ENCOUNTER — Emergency Department: Payer: Medicaid Other

## 2017-12-14 ENCOUNTER — Encounter: Payer: Self-pay | Admitting: Emergency Medicine

## 2017-12-14 ENCOUNTER — Other Ambulatory Visit: Payer: Self-pay

## 2017-12-14 DIAGNOSIS — F17210 Nicotine dependence, cigarettes, uncomplicated: Secondary | ICD-10-CM | POA: Insufficient documentation

## 2017-12-14 DIAGNOSIS — S0502XA Injury of conjunctiva and corneal abrasion without foreign body, left eye, initial encounter: Secondary | ICD-10-CM | POA: Insufficient documentation

## 2017-12-14 DIAGNOSIS — Y929 Unspecified place or not applicable: Secondary | ICD-10-CM | POA: Insufficient documentation

## 2017-12-14 DIAGNOSIS — Y999 Unspecified external cause status: Secondary | ICD-10-CM | POA: Insufficient documentation

## 2017-12-14 DIAGNOSIS — Z79899 Other long term (current) drug therapy: Secondary | ICD-10-CM | POA: Insufficient documentation

## 2017-12-14 DIAGNOSIS — Y9389 Activity, other specified: Secondary | ICD-10-CM | POA: Insufficient documentation

## 2017-12-14 DIAGNOSIS — S0181XA Laceration without foreign body of other part of head, initial encounter: Secondary | ICD-10-CM

## 2017-12-14 DIAGNOSIS — W228XXA Striking against or struck by other objects, initial encounter: Secondary | ICD-10-CM | POA: Insufficient documentation

## 2017-12-14 MED ORDER — MORPHINE SULFATE (PF) 2 MG/ML IV SOLN
2.0000 mg | Freq: Once | INTRAVENOUS | Status: AC
  Administered 2017-12-14: 2 mg via INTRAVENOUS
  Filled 2017-12-14: qty 1

## 2017-12-14 MED ORDER — LIDOCAINE HCL (PF) 1 % IJ SOLN
INTRAMUSCULAR | Status: AC
  Administered 2017-12-14: 5 mL
  Filled 2017-12-14: qty 5

## 2017-12-14 MED ORDER — ERYTHROMYCIN 5 MG/GM OP OINT: 1.0000 "application " | TOPICAL_OINTMENT | Freq: Three times a day (TID) | OPHTHALMIC | 0 refills | Status: DC

## 2017-12-14 MED ORDER — ERYTHROMYCIN 5 MG/GM OP OINT
TOPICAL_OINTMENT | Freq: Once | OPHTHALMIC | Status: AC
  Administered 2017-12-14: 1 via OPHTHALMIC
  Filled 2017-12-14: qty 1

## 2017-12-14 MED ORDER — TETRACAINE HCL 0.5 % OP SOLN
2.0000 [drp] | Freq: Once | OPHTHALMIC | Status: AC
  Administered 2017-12-14: 2 [drp] via OPHTHALMIC
  Filled 2017-12-14: qty 4

## 2017-12-14 MED ORDER — TRAMADOL HCL 50 MG PO TABS: 50.0000 mg | ORAL_TABLET | Freq: Four times a day (QID) | ORAL | 0 refills | Status: DC | PRN

## 2017-12-14 NOTE — ED Provider Notes (Signed)
[facility] Emergency Department Provider Note    First MD Initiated Contact with Patient 12/14/17 [phone]     (approximate)  I have reviewed the triage vital signs and the nursing notes.   HISTORY  Chief Complaint Eye Injury    HPI Wesley Hart is a 32 y.o. 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  Patient states that he was not wearing eye protective gear when the power washer fell in the stream of water caused a laceration to his nose.  Patient states that he believes he was struck by the power washer in his left eye as well.  Patient admits to 8 out of 10 pain at this time.  Patient admits to photophobia of the left eye.   Past Medical History:  Diagnosis Date  . Lactose intolerance   . Stomach ulcer     There are no active problems to display for this patient.   Past Surgical History:  Procedure Laterality Date  . FRACTURE SURGERY  2005  . KNEE ARTHROSCOPY Left 09/22/2016   Procedure: ARTHROSCOPY KNEE;  Surgeon: Corky Mull, MD;  Location: [facility] ORS;  Service: Orthopedics;  Laterality: Left;  . ORIF TIBIA PLATEAU Left 09/22/2016   Procedure: OPEN REDUCTION INTERNAL FIXATION (ORIF) TIBIAL PLATEAU;  Surgeon: Corky Mull, MD;  Location: [facility] ORS;  Service: Orthopedics;  Laterality: Left;    Prior to Admission medications   Medication Sig Start Date End Date Taking? Authorizing Provider  cyclobenzaprine (FLEXERIL) 5 MG tablet 1 tablet every 8 hours as he did for muscle spasms 10/26/17   Paulette Blanch, MD  erythromycin ophthalmic ointment Place 1 application into the left eye 3 (three) times daily. 12/14/17   Gregor Hams, MD  ibuprofen (ADVIL,MOTRIN) 800 MG tablet Take 1 tablet (800 mg total) by mouth every 8 (eight) hours as needed for moderate pain. 10/26/17   Paulette Blanch, MD  meclizine (ANTIVERT) 25 MG tablet Take 1 tablet (25 mg total) by mouth 3 (three) times daily as needed for dizziness.  08/21/16   Loney Hering, MD  naproxen (NAPROSYN) 500 MG tablet Take 1 tablet (500 mg total) by mouth 2 (two) times daily with a meal. 02/28/17   Lavonia Drafts, MD  oxyCODONE (ROXICODONE) 5 MG immediate release tablet Take 1-2 tablets (5-10 mg total) by mouth every 4 (four) hours as needed for severe pain. 09/22/16   Poggi, Marshall Cork, MD  oxyCODONE-acetaminophen (PERCOCET/ROXICET) 5-325 MG tablet Take 1 tablet by mouth every 4 (four) hours as needed for severe pain. 10/26/17   Paulette Blanch, MD    Allergies Mushroom extract complex; Lactose intolerance (gi); Penicillins; and Prednisone  No family history on file.  Social History Social History   Tobacco Use  . Smoking status: Current Every Day Smoker    Packs/day: 0.25    Years: 18.00    Pack years: 4.50    Types: Cigarettes  . Smokeless tobacco: Never Used  Substance Use Topics  . Alcohol use: Yes    Comment: 1-2 BEERS weekly  . Drug use: Yes    Types: Marijuana    Comment: PT DENIES THIS DURING PHONE INTERVIEW (09-20-16)    Review of Systems Constitutional: No fever/chills Eyes: Positive for left eye pain and photophobia ENT: No sore throat. Cardiovascular: Denies chest pain. Respiratory: Denies shortness of breath. Gastrointestinal: No abdominal pain.  No nausea, no vomiting.  No diarrhea.  No constipation. Genitourinary:  Negative for dysuria. Musculoskeletal: Negative for neck pain.  Negative for back pain. Integumentary: Negative for rash.  Positive for nasal laceration  neurological: Negative for headaches, focal weakness or numbness.   ____________________________________________   PHYSICAL EXAM:  VITAL SIGNS: ED Triage Vitals  Enc Vitals Group     BP 12/14/17 0058 (!) 162/81     Pulse Rate 12/14/17 0058 100     Resp 12/14/17 0058 17     Temp 12/14/17 0058 97.9 F (36.6 C)     Temp Source 12/14/17 0058 Oral     SpO2 12/14/17 0058 96 %     Weight 12/14/17 0059 105.2 kg (232 lb)     Height 12/14/17 0059 1.829 m  (6')     Head Circumference --      Peak Flow --      Pain Score 12/14/17 0106 7     Pain Loc --      Pain Edu? --      Excl. in [place]? --     Constitutional: Alert and oriented. Well appearing and in no acute distress. Eyes: Conjunctivae are normal. PERRL. EOMI. abrasions to the left upper eyelid.  Sub-conjunctival hemorrhage superior portion of the eye extending from 11 to 1 o'clock position.  No evidence of globe rupture Head: Atraumatic. Ears:  Healthy appearing ear canals and TMs bilaterally Nose: Laceration of the nasal bridge approximately 4 cm Mouth/Throat: Mucous membranes are moist. Oropharynx non-erythematous. Neck: No stridor.   Cardiovascular: Normal rate, regular rhythm. Good peripheral circulation. Grossly normal heart sounds. Respiratory: Normal respiratory effort.  No retractions. Lungs CTAB. Gastrointestinal: Soft and nontender. No distention.  Musculoskeletal: No lower extremity tenderness nor edema. No gross deformities of extremities. Neurologic:  Normal speech and language. No gross focal neurologic deficits are appreciated.  Skin: 4 cm linear laceration to the nasal bridge with abrasions noted to the left upper eyelid Psychiatric: Mood and affect are normal. Speech and behavior are normal.    RADIOLOGY I, Spencer, personally viewed and evaluated these images (plain radiographs) as part of my medical decision making, as well as reviewing the written report by the radiologist.  ED MD interpretation: Small lacerations to the nasal bridge and medial left upper eyelid without intraorbital injury.  Official radiology report(s): Ct Orbits Wo Contrast  Result Date: 12/14/2017 CLINICAL DATA:  Nasal bridge and upper eyelid laceration. EXAM: CT ORBITS WITHOUT CONTRAST TECHNIQUE: Multidetector CT images were obtained using the standard protocol without intravenous contrast. COMPARISON:  None. FINDINGS: Orbits: --Globes: Normal. --Bony orbit: Normal. --Preseptal soft  tissues: There is a laceration of the medial aspect of the left upper eyelid. Mild periorbital soft tissue swelling. --Intra- and extraconal orbital fat: Normal. No inflammatory stranding. --Optic nerves: Normal. --Lacrimal glands and fossae: Normal. --Extraocular muscles: Normal. Visualized sinuses:  No fluid levels or advanced mucosal thickening. Soft tissues: Small lesser a shin to the bridge of the nose. Limited intracranial: Normal. IMPRESSION: Small lacerations to the nasal bridge and medial left upper eyelid without intraorbital injury. Electronically Signed   By: Ulyses Jarred M.D.   On: 12/14/2017 02:35    ____________________________________________   PROCEDURES    .Marland KitchenLaceration Repair Date/Time: 12/16/2017 10:26 PM Performed by: Gregor Hams, MD Authorized by: Gregor Hams, MD   Consent:    Consent obtained:  Verbal   Consent given by:  Patient   Risks discussed:  Infection, pain, retained foreign body, poor cosmetic result and poor wound healing Anesthesia (see MAR for exact dosages):  Anesthesia method:  Local infiltration   Local anesthetic:  Lidocaine 1% w/o epi Laceration details:    Location:  Face   Face location:  Nose   Length (cm):  4 Repair type:    Repair type:  Simple Exploration:    Hemostasis achieved with:  Direct pressure   Wound exploration: entire depth of wound probed and visualized     Contaminated: no   Treatment:    Area cleansed with:  Saline and Betadine   Amount of cleaning:  Extensive   Irrigation solution:  Sterile saline   Visualized foreign bodies/material removed: no   Skin repair:    Repair method:  Sutures   Suture size:  6-0   Suture material:  Nylon   Suture technique:  Simple interrupted   Number of sutures:  5 Approximation:    Approximation:  Close Post-procedure details:    Dressing:  Sterile dressing   Patient tolerance of procedure:  Tolerated well, no immediate  complications     ____________________________________________   INITIAL IMPRESSION / ASSESSMENT AND PLAN / ED COURSE  As part of my medical decision making, I reviewed the following data within the electronic MEDICAL RECORD NUMBER   32 year old male presenting with above-stated history and physical exam secondary to laceration to the nasal bridge and abrasions to the left upper eyelid with a some conjunctival hemorrhage with overlying corneal abrasion.  No clinical evidence of globe rupture on physical exam however CT orbit was performed to evaluate further which was negative.  Patient's wound was repaired without difficulty.  Erythromycin ophthalmic was given to the patient as well as IV morphine with pain control.  Patient's vision was normal following tetracaine introduction into the left eye.  ________________________  FINAL CLINICAL IMPRESSION(S) / ED DIAGNOSES  Final diagnoses:  Facial laceration, initial encounter  Corneal abrasion, left, initial encounter     MEDICATIONS GIVEN DURING THIS VISIT:  Medications  morphine 2 MG/ML injection 2 mg (2 mg Intravenous Given 12/14/17 0141)  tetracaine (PONTOCAINE) 0.5 % ophthalmic solution 2 drop (2 drops Left Eye Given 12/14/17 0141)  lidocaine (PF) (XYLOCAINE) 1 % injection (5 mLs  Given 12/14/17 0454)  erythromycin ophthalmic ointment (1 application Left Eye Given 12/14/17 0454)     ED Discharge Orders        Ordered    erythromycin ophthalmic ointment  3 times daily     12/14/17 1245       Note:  This document was prepared using Dragon voice recognition software and may include unintentional dictation errors.    Gregor Hams, MD 12/16/17 2228

## 2017-12-14 NOTE — ED Notes (Signed)
Pressure washer at work pulled back and hit his face and left eye. Laceration to bridge of nose and unable to open left eye. Laceration noted to lid of eye. Pt states his eye is throbbing and feels like it could jump out of his head. Warm dry dressing across eye

## 2017-12-14 NOTE — ED Triage Notes (Addendum)
Patient ambulatory to triage with steady gait, without difficulty or distress noted; pt reports injury while using pressure washer at work; lac noted bridge of nose and left eyelid, swelling to left eye,unable to open

## 2017-12-23 ENCOUNTER — Emergency Department
Admission: EM | Admit: 2017-12-23 | Discharge: 2017-12-23 | Disposition: A | Discharge status: Home / Self Care | Payer: Medicaid Other | Attending: Emergency Medicine | Admitting: Emergency Medicine

## 2017-12-23 ENCOUNTER — Encounter: Payer: Self-pay | Admitting: Emergency Medicine

## 2017-12-23 ENCOUNTER — Other Ambulatory Visit: Payer: Self-pay

## 2017-12-23 DIAGNOSIS — Z4802 Encounter for removal of sutures: Secondary | ICD-10-CM

## 2017-12-23 MED ORDER — MUPIROCIN 2 % EX OINT: 1.0000 "application " | TOPICAL_OINTMENT | Freq: Two times a day (BID) | CUTANEOUS | 0 refills | Status: DC

## 2017-12-23 NOTE — Discharge Instructions (Addendum)
Body of the Bactroban ointment to the area if he continues to get pus out of the edge of the suture line.  If the area is worsening please return the emergency department

## 2017-12-23 NOTE — ED Triage Notes (Signed)
PT here to remove stiches to forehead. Stitches clean and dry

## 2017-12-23 NOTE — ED Provider Notes (Signed)
[facility] Emergency Department Provider Note  ____________________________________________   First MD Initiated Contact with Patient 12/23/17 1150     (approximate)  I have reviewed the triage vital signs and the nursing notes.   HISTORY  Chief Complaint No chief complaint on file.    HPI Wesley Hart is a 32 y.o. male who presents to the emergency department for suture removal.  He states he has had no difficulty with sutures.  He denies fever or chills.    Past Medical History:  Diagnosis Date  . Lactose intolerance   . Stomach ulcer     There are no active problems to display for this patient.   Past Surgical History:  Procedure Laterality Date  . FRACTURE SURGERY  2005  . KNEE ARTHROSCOPY Left 09/22/2016   Procedure: ARTHROSCOPY KNEE;  Surgeon: Corky Mull, MD;  Location: [facility] ORS;  Service: Orthopedics;  Laterality: Left;  . ORIF TIBIA PLATEAU Left 09/22/2016   Procedure: OPEN REDUCTION INTERNAL FIXATION (ORIF) TIBIAL PLATEAU;  Surgeon: Corky Mull, MD;  Location: [facility] ORS;  Service: Orthopedics;  Laterality: Left;    Prior to Admission medications   Medication Sig Start Date End Date Taking? Authorizing Provider  mupirocin ointment (BACTROBAN) 2 % Apply 1 application topically 2 (two) times daily. 12/23/17   Versie Starks, PA-C    Allergies Mushroom extract complex; Lactose intolerance (gi); Penicillins; and Prednisone  No family history on file.  Social History Social History   Tobacco Use  . Smoking status: Current Every Day Smoker    Packs/day: 0.25    Years: 18.00    Pack years: 4.50    Types: Cigarettes  . Smokeless tobacco: Never Used  Substance Use Topics  . Alcohol use: Yes    Comment: 1-2 BEERS weekly  . Drug use: Yes    Types: Marijuana    Comment: PT DENIES THIS DURING PHONE INTERVIEW (09-20-16)    Review of Systems  Constitutional: No fever/chills Eyes: No visual changes. ENT: No sore  throat. Respiratory: Denies cough Genitourinary: Negative for dysuria. Musculoskeletal: Negative for back pain. Skin: Negative for rash.  Here for suture removal    ____________________________________________   PHYSICAL EXAM:  VITAL SIGNS: ED Triage Vitals  Enc Vitals Group     BP 12/23/17 1135 139/78     Pulse Rate 12/23/17 1135 65     Resp 12/23/17 1135 16     Temp 12/23/17 1135 98.4 F (36.9 C)     Temp Source 12/23/17 1135 Oral     SpO2 12/23/17 1135 100 %     Weight --      Height --      Head Circumference --      Peak Flow --      Pain Score 12/23/17 1134 0     Pain Loc --      Pain Edu? --      Excl. in [place]? --     Constitutional: Alert and oriented. Well appearing and in no acute distress. Eyes: Conjunctivae are normal.  Head: Atraumatic.  Sutures are intact.  No redness or swelling is noted. Nose: No congestion/rhinnorhea. Mouth/Throat: Mucous membranes are moist.   Neck:  supple no lymphadenopathy noted Cardiovascular: Normal rate, regular rhythm.  Respiratory: Normal respiratory effort.  No retractions, GU: deferred Musculoskeletal: FROM all extremities, warm and well perfused Neurologic:  Normal speech and language.  Skin:  Skin is warm, dry and intact. No rash noted.  Sutures are  intact and the wound is well approximated.  No redness pus or drainage is noted. Psychiatric: Mood and affect are normal. Speech and behavior are normal.  ____________________________________________   LABS (all labs ordered are listed, but only abnormal results are displayed)  Labs Reviewed - No data to display ____________________________________________   ____________________________________________  RADIOLOGY    ____________________________________________   PROCEDURES  Procedure(s) performed: Suture removal performed by the nursing staff  Procedures    ____________________________________________   INITIAL IMPRESSION / ASSESSMENT AND PLAN / ED  COURSE  Pertinent labs & imaging results that were available during my care of the patient were reviewed by me and considered in my medical decision making (see chart for details).   Patient is a 32 year old male presents emergency department for suture removal.  He states that he had stitches placed to a laceration across his face.  He has had no difficulty with the stitches  On physical exam the suture line appears well approximated.  There is no redness pus or drainage noted.  Sutures removed by nursing staff.  he was discharged in stable condition with wound care instructions.     As part of my medical decision making, I reviewed the following data within the [place] notes reviewed and incorporated, Old chart reviewed, Notes from prior ED visits and [place] Controlled Substance Database  ____________________________________________   FINAL CLINICAL IMPRESSION(S) / ED DIAGNOSES  Final diagnoses:  Visit for suture removal      NEW MEDICATIONS STARTED DURING THIS VISIT:  Discharge Medication List as of 12/23/2017 11:55 AM    START taking these medications   Details  mupirocin ointment (BACTROBAN) 2 % Apply 1 application topically 2 (two) times daily., Starting Sat 12/23/2017, Normal         Note:  This document was prepared using Dragon voice recognition software and may include unintentional dictation errors.    Versie Starks, PA-C 12/23/17 1651    Schuyler Amor, MD 12/24/17 [phone]

## 2017-12-23 NOTE — ED Notes (Signed)
Skin is well-healed. Patient tolerated removal of stitches well.

## 2018-02-01 ENCOUNTER — Other Ambulatory Visit: Payer: Self-pay

## 2018-02-01 ENCOUNTER — Emergency Department: Payer: Worker's Compensation

## 2018-02-01 ENCOUNTER — Encounter: Payer: Self-pay | Admitting: Emergency Medicine

## 2018-02-01 ENCOUNTER — Emergency Department
Admission: EM | Admit: 2018-02-01 | Discharge: 2018-02-01 | Disposition: A | Discharge status: Home / Self Care | Payer: Worker's Compensation | Attending: Emergency Medicine | Admitting: Emergency Medicine

## 2018-02-01 DIAGNOSIS — S93402A Sprain of unspecified ligament of left ankle, initial encounter: Secondary | ICD-10-CM | POA: Diagnosis not present

## 2018-02-01 DIAGNOSIS — F17210 Nicotine dependence, cigarettes, uncomplicated: Secondary | ICD-10-CM | POA: Insufficient documentation

## 2018-02-01 DIAGNOSIS — Y929 Unspecified place or not applicable: Secondary | ICD-10-CM | POA: Diagnosis not present

## 2018-02-01 DIAGNOSIS — Z79899 Other long term (current) drug therapy: Secondary | ICD-10-CM | POA: Insufficient documentation

## 2018-02-01 DIAGNOSIS — Y939 Activity, unspecified: Secondary | ICD-10-CM | POA: Insufficient documentation

## 2018-02-01 DIAGNOSIS — S99912A Unspecified injury of left ankle, initial encounter: Secondary | ICD-10-CM | POA: Diagnosis present

## 2018-02-01 DIAGNOSIS — X501XXA Overexertion from prolonged static or awkward postures, initial encounter: Secondary | ICD-10-CM | POA: Insufficient documentation

## 2018-02-01 DIAGNOSIS — Y999 Unspecified external cause status: Secondary | ICD-10-CM | POA: Diagnosis not present

## 2018-02-01 MED ORDER — IBUPROFEN 600 MG PO TABS: 600.0000 mg | ORAL_TABLET | Freq: Three times a day (TID) | ORAL | 0 refills | Status: DC | PRN

## 2018-02-01 MED ORDER — TRAMADOL HCL 50 MG PO TABS: 50.0000 mg | ORAL_TABLET | Freq: Two times a day (BID) | ORAL | 0 refills | Status: DC | PRN

## 2018-02-01 MED ORDER — IBUPROFEN 600 MG PO TABS
600.0000 mg | ORAL_TABLET | Freq: Once | ORAL | Status: AC
  Administered 2018-02-01: 600 mg via ORAL
  Filled 2018-02-01: qty 1

## 2018-02-01 MED ORDER — TRAMADOL HCL 50 MG PO TABS
50.0000 mg | ORAL_TABLET | Freq: Once | ORAL | Status: AC
  Administered 2018-02-01: 50 mg via ORAL
  Filled 2018-02-01: qty 1

## 2018-02-01 NOTE — ED Triage Notes (Signed)
Pt reports he stepped off lift onto a pallet and foot went through a gap on the pallet and twisted ankle.  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  Pt works for [employer].

## 2018-02-01 NOTE — ED Notes (Signed)
Workmans comp urine drug screen preformed.

## 2018-02-01 NOTE — ED Provider Notes (Signed)
[name] Emergency Department Provider Note   ____________________________________________   First MD Initiated Contact with Patient 02/01/18 [phone]     (approximate)  I have reviewed the triage vital signs and the nursing notes.   HISTORY  Chief Complaint Ankle Pain    HPI Wesley Hart is a 32 y.o. male patient complain of left ankle pain secondary to a twisting incident 2 days ago.  Patient state he stepped off a pallet and twisted his ankle.  Patient is a work due to pain on Tuesday but cannot go to work yesterday.  His job sent him here for evaluation and treatment.  Patient has internal fixation in the left lower leg secondary to previous injury.  Patient rates his pain as 8/10.  Patient described the pain is "aching".  No palliative measures for complaint.  Past Medical History:  Diagnosis Date  . Lactose intolerance   . Stomach ulcer     There are no active problems to display for this patient.   Past Surgical History:  Procedure Laterality Date  . FRACTURE SURGERY  2005  . KNEE ARTHROSCOPY Left 09/22/2016   Procedure: ARTHROSCOPY KNEE;  Surgeon: Corky Mull, MD;  Location: [facility] ORS;  Service: Orthopedics;  Laterality: Left;  . ORIF TIBIA PLATEAU Left 09/22/2016   Procedure: OPEN REDUCTION INTERNAL FIXATION (ORIF) TIBIAL PLATEAU;  Surgeon: Corky Mull, MD;  Location: [facility] ORS;  Service: Orthopedics;  Laterality: Left;    Prior to Admission medications   Medication Sig Start Date End Date Taking? Authorizing Provider  ibuprofen (ADVIL,MOTRIN) 600 MG tablet Take 1 tablet (600 mg total) by mouth every 8 (eight) hours as needed. 02/01/18   Sable Feil, PA-C  mupirocin ointment (BACTROBAN) 2 % Apply 1 application topically 2 (two) times daily. 12/23/17   Fisher, Linden Dolin, PA-C  traMADol (ULTRAM) 50 MG tablet Take 1 tablet (50 mg total) by mouth every 12 (twelve) hours as needed. 02/01/18   Sable Feil, PA-C    Allergies Mushroom extract  complex; Lactose intolerance (gi); Penicillins; and Prednisone  No family history on file.  Social History Social History   Tobacco Use  . Smoking status: Current Every Day Smoker    Packs/day: 0.25    Years: 18.00    Pack years: 4.50    Types: Cigarettes  . Smokeless tobacco: Never Used  Substance Use Topics  . Alcohol use: Yes    Comment: 1-2 BEERS weekly  . Drug use: Yes    Types: Marijuana    Comment: PT DENIES THIS DURING PHONE INTERVIEW (09-20-16)    Review of Systems Constitutional: No fever/chills Eyes: No visual changes. ENT: No sore throat. Cardiovascular: Denies chest pain. Respiratory: Denies shortness of breath. Gastrointestinal: No abdominal pain.  No nausea, no vomiting.  No diarrhea.  No constipation. Genitourinary: Negative for dysuria. Musculoskeletal: Left lateral ankle pain and swelling. Skin: Negative for rash. Neurological: Negative for headaches, focal weakness or numbness. Allergic/Immunilogical: See medication list. ____________________________________________   PHYSICAL EXAM:  VITAL SIGNS: ED Triage Vitals  Enc Vitals Group     BP 02/01/18 0744 (!) 153/89     Pulse Rate 02/01/18 0744 79     Resp 02/01/18 0744 18     Temp 02/01/18 0744 98.2 F (36.8 C)     Temp Source 02/01/18 0744 Oral     SpO2 02/01/18 0744 100 %     Weight 02/01/18 0745 230 lb (104.3 kg)     Height 02/01/18 0745  5\' 11"  (1.803 m)     Head Circumference --      Peak Flow --      Pain Score 02/01/18 0745 8     Pain Loc --      Pain Edu? --      Excl. in [place]? --    Constitutional: Alert and oriented. Well appearing and in no acute distress. Cardiovascular: Normal rate, regular rhythm. Grossly normal heart sounds.  Good peripheral circulation.  Elevated blood pressure. Respiratory: Normal respiratory effort.  No retractions. Lungs CTAB. Musculoskeletal: No obvious deformity to the left ankle.  Moderate edema and surgical scar consistent with prior surgery. Neurologic:   Normal speech and language. No gross focal neurologic deficits are appreciated. No gait instability. Skin:  Skin is warm, dry and intact. No rash noted. Psychiatric: Mood and affect are normal. Speech and behavior are normal.  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 Injury occurred on Tuesday EXAM: LEFT ANKLE COMPLETE - 3+ VIEW COMPARISON:  09/14/2016 FINDINGS: No fracture. No bone lesion. Distal portion of an intramedullary rod appears well-seated and is unchanged from the prior exam. Ankle joint is normally spaced and aligned. No significant arthropathic changes. Mild nonspecific soft tissue swelling. IMPRESSION: 1. No fracture or significant ankle joint abnormality. Electronically Signed   By: Lajean Manes M.D.   On: 02/01/2018 08:35    ____________________________________________   PROCEDURES  Procedure(s) performed: None  Procedures  Critical Care performed: No  ____________________________________________   INITIAL IMPRESSION / ASSESSMENT AND PLAN / ED COURSE  As part of my medical decision making, I reviewed the following data within the electronic MEDICAL RECORD NUMBER    Left ankle pain and edema secondary to sprain.  Discussed x-ray findings with patient.  Patient placed in a ankle splint and given discharge care instruction.  Patient given a work note and advised to follow-up with company doctor if condition persists.       ____________________________________________   FINAL CLINICAL IMPRESSION(S) / ED DIAGNOSES  Final diagnoses:  Sprain of left ankle, unspecified ligament, initial encounter     ED Discharge Orders         Ordered    ibuprofen (ADVIL,MOTRIN) 600 MG tablet  Every 8 hours PRN     02/01/18 0900    traMADol (ULTRAM) 50 MG tablet  Every 12 hours PRN     02/01/18 0900           Note:  This document was prepared using Dragon voice recognition software and may include unintentional dictation errors.    Sable Feil, PA-C 02/01/18 3762    Nena Polio, MD 02/03/18 [phone]

## 2018-02-01 NOTE — ED Notes (Signed)
See triage note  States he stepped off pallet at work on Tuesday  Twisted left ankle  No deformity note  Good pulses

## 2018-05-14 ENCOUNTER — Emergency Department
Admission: EM | Admit: 2018-05-14 | Discharge: 2018-05-14 | Disposition: A | Discharge status: Home / Self Care | Payer: Medicaid Other | Attending: Emergency Medicine | Admitting: Emergency Medicine

## 2018-05-14 ENCOUNTER — Other Ambulatory Visit: Payer: Self-pay

## 2018-05-14 ENCOUNTER — Emergency Department: Payer: Medicaid Other

## 2018-05-14 DIAGNOSIS — R1084 Generalized abdominal pain: Secondary | ICD-10-CM

## 2018-05-14 DIAGNOSIS — Z91040 Latex allergy status: Secondary | ICD-10-CM | POA: Insufficient documentation

## 2018-05-14 DIAGNOSIS — R197 Diarrhea, unspecified: Secondary | ICD-10-CM | POA: Insufficient documentation

## 2018-05-14 DIAGNOSIS — F1210 Cannabis abuse, uncomplicated: Secondary | ICD-10-CM | POA: Insufficient documentation

## 2018-05-14 DIAGNOSIS — K529 Noninfective gastroenteritis and colitis, unspecified: Secondary | ICD-10-CM | POA: Insufficient documentation

## 2018-05-14 DIAGNOSIS — F17210 Nicotine dependence, cigarettes, uncomplicated: Secondary | ICD-10-CM | POA: Insufficient documentation

## 2018-05-14 DIAGNOSIS — R112 Nausea with vomiting, unspecified: Secondary | ICD-10-CM | POA: Insufficient documentation

## 2018-05-14 LAB — LIPASE, BLOOD: Lipase: 36 U/L (ref 11–51)

## 2018-05-14 LAB — CBC WITH DIFFERENTIAL/PLATELET
Abs Immature Granulocytes: 0.04 10*3/uL (ref 0.00–0.07)
BASOS ABS: 0 10*3/uL (ref 0.0–0.1)
Basophils Relative: 0 %
EOS PCT: 1 %
Eosinophils Absolute: 0.1 10*3/uL (ref 0.0–0.5)
HCT: 42.8 % (ref 39.0–52.0)
Hemoglobin: 14.2 g/dL (ref 13.0–17.0)
Immature Granulocytes: 1 %
LYMPHS PCT: 18 %
Lymphs Abs: 1.4 10*3/uL (ref 0.7–4.0)
MCH: 29 pg (ref 26.0–34.0)
MCHC: 33.2 g/dL (ref 30.0–36.0)
MCV: 87.3 fL (ref 80.0–100.0)
MONO ABS: 0.3 10*3/uL (ref 0.1–1.0)
Monocytes Relative: 4 %
NEUTROS ABS: 6.1 10*3/uL (ref 1.7–7.7)
NRBC: 0 % (ref 0.0–0.2)
Neutrophils Relative %: 76 %
PLATELETS: 213 10*3/uL (ref 150–400)
RBC: 4.9 MIL/uL (ref 4.22–5.81)
RDW: 13.8 % (ref 11.5–15.5)
WBC: 8 10*3/uL (ref 4.0–10.5)

## 2018-05-14 LAB — URINALYSIS, COMPLETE (UACMP) WITH MICROSCOPIC
BILIRUBIN URINE: NEGATIVE
Bacteria, UA: NONE SEEN
Glucose, UA: NEGATIVE mg/dL
HGB URINE DIPSTICK: NEGATIVE
Ketones, ur: NEGATIVE mg/dL
LEUKOCYTES UA: NEGATIVE
NITRITE: NEGATIVE
PROTEIN: NEGATIVE mg/dL
Specific Gravity, Urine: 1.016 (ref 1.005–1.030)
WBC UA: NONE SEEN WBC/hpf (ref 0–5)
pH: 7 (ref 5.0–8.0)

## 2018-05-14 LAB — COMPREHENSIVE METABOLIC PANEL
ALT: 20 U/L (ref 0–44)
AST: 22 U/L (ref 15–41)
Albumin: 3.6 g/dL (ref 3.5–5.0)
Alkaline Phosphatase: 39 U/L (ref 38–126)
Anion gap: 6 (ref 5–15)
BUN: 14 mg/dL (ref 6–20)
CHLORIDE: 108 mmol/L (ref 98–111)
CO2: 23 mmol/L (ref 22–32)
Calcium: 8.7 mg/dL — ABNORMAL LOW (ref 8.9–10.3)
Creatinine, Ser: 1.11 mg/dL (ref 0.61–1.24)
GFR calc non Af Amer: >60 mL/min (ref >60)
GLUCOSE: 94 mg/dL (ref 70–99)
POTASSIUM: 3.8 mmol/L (ref 3.5–5.1)
SODIUM: 137 mmol/L (ref 135–145)
Total Bilirubin: 0.4 mg/dL (ref 0.3–1.2)
Total Protein: 5.9 g/dL — ABNORMAL LOW (ref 6.5–8.1)

## 2018-05-14 MED ORDER — MORPHINE SULFATE (PF) 4 MG/ML IV SOLN
4.0000 mg | Freq: Once | INTRAVENOUS | Status: AC
  Administered 2018-05-14: 4 mg via INTRAVENOUS
  Filled 2018-05-14: qty 1

## 2018-05-14 MED ORDER — DICYCLOMINE HCL 20 MG PO TABS: 20.0000 mg | ORAL_TABLET | Freq: Three times a day (TID) | ORAL | 0 refills | Status: DC | PRN

## 2018-05-14 MED ORDER — IOPAMIDOL (ISOVUE-300) INJECTION 61%
100.0000 mL | Freq: Once | INTRAVENOUS | Status: AC | PRN
  Administered 2018-05-14: 100 mL via INTRAVENOUS

## 2018-05-14 MED ORDER — ONDANSETRON HCL 4 MG PO TABS: 4.0000 mg | ORAL_TABLET | Freq: Every day | ORAL | 0 refills | Status: DC | PRN

## 2018-05-14 MED ORDER — ALUM & MAG HYDROXIDE-SIMETH 200-200-20 MG/5ML PO SUSP
30.0000 mL | Freq: Once | ORAL | Status: AC
  Administered 2018-05-14: 30 mL via ORAL
  Filled 2018-05-14: qty 30

## 2018-05-14 MED ORDER — SODIUM CHLORIDE 0.9 % IV BOLUS
1000.0000 mL | Freq: Once | INTRAVENOUS | Status: AC
  Administered 2018-05-14: 1000 mL via INTRAVENOUS

## 2018-05-14 MED ORDER — ONDANSETRON HCL 4 MG/2ML IJ SOLN
4.0000 mg | Freq: Once | INTRAMUSCULAR | Status: AC
  Administered 2018-05-14: 4 mg via INTRAVENOUS
  Filled 2018-05-14: qty 2

## 2018-05-14 NOTE — ED Notes (Signed)
Pt informed we still need a urine specimen. Pt awakened from sleep.

## 2018-05-14 NOTE — ED Notes (Signed)
Pt given warm blankets for comfort.

## 2018-05-14 NOTE — ED Provider Notes (Signed)
[facility] Emergency Department Provider Note  ____________________________________________   First MD Initiated Contact with Patient 05/14/18 [phone]     (approximate)  I have reviewed the triage vital signs and the nursing notes.   HISTORY  Chief Complaint Abdominal Pain   HPI Wesley Hart is a 32 y.o. male with a history of lactose intolerance as well as stomach ulcer who is brought into the emergency department today with abdominal pain, nausea and vomiting and a small amount of loose stool.  He says the pain is an 8 out of 10 and cramping into the upper abdomen.  Does not report radiation to the back.  Says that he drinks approximately every other day and has about 3 beers.  Denies any drug use.  Suspect that the episode was triggered by him eating several slices of cheese yesterday as well as macaroni.  However, despite having a diagnosis of lactose intolerance he says he eats dairy regularly and he often needs more dairy that he ate yesterday without any adverse effects.  No known sick contacts.   Past Medical History:  Diagnosis Date  . Lactose intolerance   . Stomach ulcer     There are no active problems to display for this patient.   Past Surgical History:  Procedure Laterality Date  . FRACTURE SURGERY  2005  . KNEE ARTHROSCOPY Left 09/22/2016   Procedure: ARTHROSCOPY KNEE;  Surgeon: Corky Mull, MD;  Location: [facility] ORS;  Service: Orthopedics;  Laterality: Left;  . ORIF TIBIA PLATEAU Left 09/22/2016   Procedure: OPEN REDUCTION INTERNAL FIXATION (ORIF) TIBIAL PLATEAU;  Surgeon: Corky Mull, MD;  Location: [facility] ORS;  Service: Orthopedics;  Laterality: Left;    Prior to Admission medications   Medication Sig Start Date End Date Taking? Authorizing Provider  ibuprofen (ADVIL,MOTRIN) 600 MG tablet Take 1 tablet (600 mg total) by mouth every 8 (eight) hours as needed. Patient not taking: Reported on 05/14/2018 02/01/18   Sable Feil, PA-C    meloxicam (MOBIC) 15 MG tablet Take 1 tablet by mouth daily.    [provider]  mupirocin ointment (BACTROBAN) 2 % Apply 1 application topically 2 (two) times daily. Patient not taking: Reported on 05/14/2018 12/23/17   Versie Starks, PA-C  traMADol (ULTRAM) 50 MG tablet Take 1 tablet (50 mg total) by mouth every 12 (twelve) hours as needed. Patient not taking: Reported on 05/14/2018 02/01/18   Sable Feil, PA-C    Allergies Mushroom extract complex; Lactose intolerance (gi); Penicillins; and Prednisone  No family history on file.  Social History Social History   Tobacco Use  . Smoking status: Current Every Day Smoker    Packs/day: 0.25    Years: 18.00    Pack years: 4.50    Types: Cigarettes  . Smokeless tobacco: Never Used  Substance Use Topics  . Alcohol use: Yes    Comment: 1-2 BEERS weekly  . Drug use: Yes    Types: Marijuana    Comment: PT DENIES THIS DURING PHONE INTERVIEW (09-20-16)    Review of Systems  Constitutional: No fever/chills Eyes: No visual changes. ENT: No sore throat. Cardiovascular: Denies chest pain. Respiratory: Denies shortness of breath. Gastrointestinal: No constipation. Genitourinary: Negative for dysuria. Musculoskeletal: Negative for back pain. Skin: Negative for rash. Neurological: Negative for headaches, focal weakness or numbness.   ____________________________________________   PHYSICAL EXAM:  VITAL SIGNS: ED Triage Vitals  Enc Vitals Group     BP 05/14/18 0902 138/86  Pulse Rate 05/14/18 0902 66     Resp 05/14/18 0902 18     Temp 05/14/18 0902 98.2 F (36.8 C)     Temp Source 05/14/18 0902 Oral     SpO2 05/14/18 0902 99 %     Weight 05/14/18 0903 238 lb (108 kg)     Height 05/14/18 0903 5\' 11"  (1.803 m)     Head Circumference --      Peak Flow --      Pain Score 05/14/18 0903 10     Pain Loc --      Pain Edu? --      Excl. in [place]? --     Constitutional: Alert and oriented. Well appearing and in no  acute distress. Eyes: Conjunctivae are normal.  Head: Atraumatic. Nose: No congestion/rhinnorhea. Mouth/Throat: Mucous membranes are moist.  Neck: No stridor.   Cardiovascular: Normal rate, regular rhythm. Grossly normal heart sounds.  Good peripheral circulation. Respiratory: Normal respiratory effort.  No retractions. Lungs CTAB. Gastrointestinal: Soft with minimal and diffuse tenderness to palpation without any rebound or guarding.  Negative Murphy sign.  No distention. Musculoskeletal: No lower extremity tenderness nor edema.  No joint effusions. Neurologic:  Normal speech and language. No gross focal neurologic deficits are appreciated. Skin:  Skin is warm, dry and intact. No rash noted. Psychiatric: Mood and affect are normal. Speech and behavior are normal.  ____________________________________________   LABS (all labs ordered are listed, but only abnormal results are displayed)  Labs Reviewed  COMPREHENSIVE METABOLIC PANEL - Abnormal; Notable for the following components:      Result Value   Calcium 8.7 (*)    Total Protein 5.9 (*)    All other components within normal limits  URINALYSIS, COMPLETE (UACMP) WITH MICROSCOPIC - Abnormal; Notable for the following components:   Color, Urine STRAW (*)    APPearance CLEAR (*)    All other components within normal limits  CBC WITH DIFFERENTIAL/PLATELET  LIPASE, BLOOD   ____________________________________________  EKG  ED ECG REPORT I, Doran Stabler, the attending physician, personally viewed and interpreted this ECG.   Date: 05/14/2018  EKG Time: 0901  Rate: 68  Rhythm: normal sinus rhythm  Axis: Normal  Intervals:none  ST&T Change: Mild ST elevations which are concave in V2 as well as V3 with less but very minimal elevation in aVR as well as V4 through V6 as well as 1 and aVL.  Likely consistent with early repolarization.  ____________________________________________  RADIOLOGY  Chest x-ray without acute  process.  Abdominal CT with bowel wall thickening of several small bowel loops in the right pelvis.  Consistent with small bowel enteritis. ____________________________________________   PROCEDURES  Procedure(s) performed:   Procedures  Critical Care performed:   ____________________________________________   INITIAL IMPRESSION / ASSESSMENT AND PLAN / ED COURSE  Pertinent labs & imaging results that were available during my care of the patient were reviewed by me and considered in my medical decision making (see chart for details).  Differential diagnosis includes, but is not limited to, biliary disease (biliary colic, acute cholecystitis, cholangitis, choledocholithiasis, etc), intrathoracic causes for epigastric abdominal pain including ACS, gastritis, duodenitis, pancreatitis, small bowel or large bowel obstruction, abdominal aortic aneurysm, hernia, and ulcer(s). Differential diagnosis includes, but is not limited to, acute appendicitis, renal colic, testicular torsion, urinary tract infection/pyelonephritis, prostatitis,  epididymitis, diverticulitis, small bowel obstruction or ileus, colitis, abdominal aortic aneurysm, gastroenteritis, hernia, etc. As part of my medical decision making, I reviewed the following data within  the electronic MEDICAL RECORD NUMBER Notes from prior ED visits  ----------------------------------------- 2:19 PM on 05/14/2018 -----------------------------------------  Patient was reassessed and had pain that was now radiating to the right lower quadrant.  Tender in the right lower quadrant without rebound or guarding.  However, CT reassuring for nonsurgical cause of the abdominal pain.  Enteritis versus inflammatory cause.  Patient to be discharged with Bentyl as well as Zofran.  Tolerating p.o. fluids.  He will be given follow-up with GI.  He is understanding of the diagnosis well treatment plan and willing to  comply. ____________________________________________   FINAL CLINICAL IMPRESSION(S) / ED DIAGNOSES  Abdominal pain.  Enteritis.  Nausea, vomiting and diarrhea.  NEW MEDICATIONS STARTED DURING THIS VISIT:  New Prescriptions   No medications on file     Note:  This document was prepared using Dragon voice recognition software and may include unintentional dictation errors.     Orbie Pyo, MD 05/14/18 [phone]
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
## 2018-05-14 NOTE — ED Notes (Signed)
Pt returned from CT °

## 2018-05-15 ENCOUNTER — Emergency Department
Admission: EM | Admit: 2018-05-15 | Discharge: 2018-05-15 | Disposition: A | Discharge status: Home / Self Care | Payer: Medicaid Other | Attending: Emergency Medicine | Admitting: Emergency Medicine

## 2018-05-15 ENCOUNTER — Emergency Department: Payer: Medicaid Other

## 2018-05-15 ENCOUNTER — Encounter: Payer: Self-pay | Admitting: Emergency Medicine

## 2018-05-15 DIAGNOSIS — F17210 Nicotine dependence, cigarettes, uncomplicated: Secondary | ICD-10-CM | POA: Insufficient documentation

## 2018-05-15 DIAGNOSIS — N433 Hydrocele, unspecified: Secondary | ICD-10-CM

## 2018-05-15 DIAGNOSIS — Z79899 Other long term (current) drug therapy: Secondary | ICD-10-CM | POA: Insufficient documentation

## 2018-05-15 DIAGNOSIS — R609 Edema, unspecified: Secondary | ICD-10-CM

## 2018-05-15 LAB — URINALYSIS, COMPLETE (UACMP) WITH MICROSCOPIC
BACTERIA UA: NONE SEEN
BILIRUBIN URINE: NEGATIVE
Glucose, UA: NEGATIVE mg/dL
Hgb urine dipstick: NEGATIVE
KETONES UR: NEGATIVE mg/dL
LEUKOCYTES UA: NEGATIVE
Nitrite: NEGATIVE
Protein, ur: NEGATIVE mg/dL
SQUAMOUS EPITHELIAL / LPF: NONE SEEN (ref 0–5)
Specific Gravity, Urine: 1.026 (ref 1.005–1.030)
pH: 5 (ref 5.0–8.0)

## 2018-05-15 NOTE — ED Triage Notes (Signed)
This RN along with EDT Linus Orn as witness visualized patient's scrotum to assess for swelling.  Patient's right testicle is obviously swollen and patient reports discomfort with sitting and movement.

## 2018-05-15 NOTE — ED Notes (Signed)
Pt states he has had a previous reaction to normal saline like this in the past. Pt states in the past, both testicles were so swollen that he could not get out of bed to come to the hospital. Pt states he had to use ice packs and heating packs until the swelling went down with his previous experience. Pt currently in NAD. This RN will continue to monitor.

## 2018-05-15 NOTE — ED Triage Notes (Signed)
Pt reports is having an allergic reaction to saline. Pt reports was seen here yesterday, received some saline and now is having a reaction. Pt reports reaction is his testicles swelling. Pt denies SOB

## 2018-05-15 NOTE — ED Provider Notes (Signed)
[facility] Emergency Department Provider Note  ____________________________________________  Time seen: Approximately 6:05 PM  I have reviewed the triage vital signs and the nursing notes.   HISTORY  Chief Complaint Allergic Reaction    HPI Wesley Hart is a 32 y.o. male with a history of lactose intolerance and stomach ulcer who comes to the ED today complaining of swelling in his testicles.  He denies any trauma, hematuria, dysuria frequency urgency, or abnormal discharge.  He is sexually active with his girlfriend, his only sexual partner.  Denies any scrotal pain or fever chills vomiting today.  Swelling is gradual onset, constant, no aggravating or alleviating factors.  As a side note, patient's Link Snuffer is that the swelling is an allergic reaction to saline.  He notes that whenever he donates plasma and his red cells are given back to him with saline, he does not get a reaction to that but he thinks that receiving IV saline on its own causes scrotal swelling as a reaction.  Past Medical History:  Diagnosis Date  . Lactose intolerance   . Stomach ulcer      There are no active problems to display for this patient.    Past Surgical History:  Procedure Laterality Date  . FRACTURE SURGERY  2005  . KNEE ARTHROSCOPY Left 09/22/2016   Procedure: ARTHROSCOPY KNEE;  Surgeon: Corky Mull, MD;  Location: [facility] ORS;  Service: Orthopedics;  Laterality: Left;  . ORIF TIBIA PLATEAU Left 09/22/2016   Procedure: OPEN REDUCTION INTERNAL FIXATION (ORIF) TIBIAL PLATEAU;  Surgeon: Corky Mull, MD;  Location: [facility] ORS;  Service: Orthopedics;  Laterality: Left;     Prior to Admission medications   Medication Sig Start Date End Date Taking? Authorizing Provider  dicyclomine (BENTYL) 20 MG tablet Take 1 tablet (20 mg total) by mouth 3 (three) times daily as needed for spasms. 05/14/18 05/14/19  Orbie Pyo, MD  ibuprofen (ADVIL,MOTRIN) 600 MG tablet Take 1  tablet (600 mg total) by mouth every 8 (eight) hours as needed. Patient not taking: Reported on 05/14/2018 02/01/18   Sable Feil, PA-C  meloxicam (MOBIC) 15 MG tablet Take 1 tablet by mouth daily.    [provider]  mupirocin ointment (BACTROBAN) 2 % Apply 1 application topically 2 (two) times daily. Patient not taking: Reported on 05/14/2018 12/23/17   Versie Starks, PA-C  ondansetron (ZOFRAN) 4 MG tablet Take 1 tablet (4 mg total) by mouth daily as needed. 05/14/18   Schaevitz, Randall An, MD  traMADol (ULTRAM) 50 MG tablet Take 1 tablet (50 mg total) by mouth every 12 (twelve) hours as needed. Patient not taking: Reported on 05/14/2018 02/01/18   Sable Feil, PA-C     Allergies Mushroom extract complex; Lactose intolerance (gi); Normal saline [sodium chloride]; Penicillins; and Prednisone   No family history on file.  Social History Social History   Tobacco Use  . Smoking status: Current Every Day Smoker    Packs/day: 0.25    Years: 18.00    Pack years: 4.50    Types: Cigarettes  . Smokeless tobacco: Never Used  Substance Use Topics  . Alcohol use: Yes    Comment: 1-2 BEERS weekly  . Drug use: Yes    Types: Marijuana    Comment: PT DENIES THIS DURING PHONE INTERVIEW (09-20-16)    Review of Systems  Constitutional:   No fever or chills.  ENT:   No sore throat. No rhinorrhea. Cardiovascular:   No chest pain  or syncope. Respiratory:   No dyspnea or cough. Gastrointestinal:   Negative for abdominal pain, vomiting and diarrhea.  Musculoskeletal:   Negative for focal pain or swelling All other systems reviewed and are negative except as documented above in ROS and HPI.  ____________________________________________   PHYSICAL EXAM:  VITAL SIGNS: ED Triage Vitals  Enc Vitals Group     BP 05/15/18 1335 (!) 141/76     Pulse Rate 05/15/18 1335 68     Resp 05/15/18 1335 18     Temp 05/15/18 1335 98.4 F (36.9 C)     Temp Source 05/15/18 1335 Oral      SpO2 05/15/18 1335 100 %     Weight 05/15/18 1310 240 lb (108.9 kg)     Height 05/15/18 1310 5\' 11"  (1.803 m)     Head Circumference --      Peak Flow --      Pain Score 05/15/18 1310 7     Pain Loc --      Pain Edu? --      Excl. in [place]? --     Vital signs reviewed, nursing assessments reviewed.   Constitutional:   Alert and oriented. Non-toxic appearance. Eyes:   Conjunctivae are normal. EOMI. PERRL. ENT      Head:   Normocephalic and atraumatic.      Nose:   No congestion/rhinnorhea.       Mouth/Throat:   MMM.       Neck:   No meningismus. Full ROM. Hematological/Lymphatic/Immunilogical:   No cervical lymphadenopathy. Cardiovascular:   RRR. Symmetric bilateral radial and DP pulses.  No murmurs. Cap refill less than 2 seconds. Respiratory:   Normal respiratory effort without tachypnea/retractions. Breath sounds are clear and equal bilaterally. No wheezes/rales/rhonchi. Gastrointestinal:   Soft and nontender. Non distended. There is no CVA tenderness.  No rebound, rigidity, or guarding. Genitourinary:   No lesions or penile discharge.  No inguinal lymphadenopathy or hernias.  There is swelling of the scrotum without induration or soft tissue edema.  No inflammatory changes.  No scrotal tenderness or testicular or epididymis tenderness.  No horizontal lie. Musculoskeletal:   Normal range of motion in all extremities. No joint effusions.  No lower extremity tenderness.  No edema. Neurologic:   Normal speech and language.  Motor grossly intact. No acute focal neurologic deficits are appreciated.  Skin:    Skin is warm, dry and intact. No rash noted.  No petechiae, purpura, or bullae.  ____________________________________________    LABS (pertinent positives/negatives) (all labs ordered are listed, but only abnormal results are displayed) Labs Reviewed  URINALYSIS, COMPLETE (UACMP) WITH MICROSCOPIC - Abnormal; Notable for the following components:      Result Value   Color,  Urine YELLOW (*)    APPearance CLEAR (*)    All other components within normal limits   ____________________________________________   EKG    ____________________________________________    RADIOLOGY  US Scrotum W/doppler  Result Date: 05/15/2018 CLINICAL DATA:  RIGHT testicular pain and swelling for 1 day EXAM: SCROTAL ULTRASOUND DOPPLER ULTRASOUND OF THE TESTICLES TECHNIQUE: Complete ultrasound examination of the testicles, epididymis, and other scrotal structures was performed. Color and spectral Doppler ultrasound were also utilized to evaluate blood flow to the testicles. COMPARISON:  None FINDINGS: Right testicle Measurements: 4.1 x 2.4 x 3.0 cm. Appendix testis noted. Normal echogenicity without mass or calcification. Internal blood flow present on color Doppler imaging. Left testicle Measurements: 3.6 x 2.4 x 3.0 cm. Normal morphology without mass  or calcification. Internal blood flow present on color Doppler imaging, symmetric with RIGHT. Right epididymis:  Normal in size and appearance. Left epididymis:  BILATERAL hydroceles, large RIGHT and small LEFT. Hydrocele:  Absent bilaterally Varicocele:  None visualized. Pulsed Doppler interrogation of both testes demonstrates normal low resistance arterial and venous waveforms bilaterally. IMPRESSION: Large RIGHT and small LEFT hydroceles. Otherwise normal exam. Electronically Signed   By: Lavonia Dana M.D.   On: 05/15/2018 14:25    ____________________________________________   PROCEDURES Procedures  ____________________________________________    CLINICAL IMPRESSION / ASSESSMENT AND PLAN / ED COURSE  Pertinent labs & imaging results that were available during my care of the patient were reviewed by me and considered in my medical decision making (see chart for details).    Patient presents with scrotal swelling.  Ultrasound is unremarkable, urinalysis is negative.  He is found to have a hydrocele.  Recommend support,  follow-up with urology.  NSAIDs as needed.  No evidence of infection or torsion, stable for discharge home.      ____________________________________________   FINAL CLINICAL IMPRESSION(S) / ED DIAGNOSES    Final diagnoses:  Hydrocele in adult     ED Discharge Orders    None      Portions of this note were generated with dragon dictation software. Dictation errors may occur despite best attempts at proofreading.   Carrie Mew, MD 05/15/18 [phone]
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
## 2018-09-24 ENCOUNTER — Other Ambulatory Visit: Payer: Self-pay

## 2018-09-24 ENCOUNTER — Inpatient Hospital Stay
Admission: EM | Admit: 2018-09-24 | Discharge: 2018-09-26 | DRG: 392 | Disposition: A | Discharge status: Home / Self Care | Payer: Medicaid Other | Attending: Internal Medicine | Admitting: Internal Medicine

## 2018-09-24 ENCOUNTER — Encounter: Payer: Self-pay | Admitting: Radiology

## 2018-09-24 ENCOUNTER — Emergency Department: Payer: Medicaid Other

## 2018-09-24 DIAGNOSIS — R109 Unspecified abdominal pain: Secondary | ICD-10-CM | POA: Diagnosis present

## 2018-09-24 DIAGNOSIS — Z880 Allergy status to penicillin: Secondary | ICD-10-CM

## 2018-09-24 DIAGNOSIS — R1084 Generalized abdominal pain: Secondary | ICD-10-CM

## 2018-09-24 DIAGNOSIS — E739 Lactose intolerance, unspecified: Secondary | ICD-10-CM | POA: Diagnosis present

## 2018-09-24 DIAGNOSIS — F17210 Nicotine dependence, cigarettes, uncomplicated: Secondary | ICD-10-CM | POA: Diagnosis present

## 2018-09-24 DIAGNOSIS — K529 Noninfective gastroenteritis and colitis, unspecified: Principal | ICD-10-CM | POA: Diagnosis present

## 2018-09-24 DIAGNOSIS — Z888 Allergy status to other drugs, medicaments and biological substances status: Secondary | ICD-10-CM

## 2018-09-24 DIAGNOSIS — R112 Nausea with vomiting, unspecified: Secondary | ICD-10-CM

## 2018-09-24 DIAGNOSIS — R825 Elevated urine levels of drugs, medicaments and biological substances: Secondary | ICD-10-CM | POA: Diagnosis present

## 2018-09-24 DIAGNOSIS — Z5309 Procedure and treatment not carried out because of other contraindication: Secondary | ICD-10-CM | POA: Diagnosis present

## 2018-09-24 DIAGNOSIS — Z8711 Personal history of peptic ulcer disease: Secondary | ICD-10-CM

## 2018-09-24 LAB — CBC WITH DIFFERENTIAL/PLATELET
Abs Immature Granulocytes: 0.03 10*3/uL (ref 0.00–0.07)
Basophils Absolute: 0 10*3/uL (ref 0.0–0.1)
Basophils Relative: 0 %
Eosinophils Absolute: 0.1 10*3/uL (ref 0.0–0.5)
Eosinophils Relative: 2 %
HCT: 44 % (ref 39.0–52.0)
Hemoglobin: 14.7 g/dL (ref 13.0–17.0)
Immature Granulocytes: 0 %
Lymphocytes Relative: 30 %
Lymphs Abs: 2.1 10*3/uL (ref 0.7–4.0)
MCH: 29 pg (ref 26.0–34.0)
MCHC: 33.4 g/dL (ref 30.0–36.0)
MCV: 86.8 fL (ref 80.0–100.0)
Monocytes Absolute: 0.4 10*3/uL (ref 0.1–1.0)
Monocytes Relative: 6 %
Neutro Abs: 4.4 10*3/uL (ref 1.7–7.7)
Neutrophils Relative %: 62 %
Platelets: 235 10*3/uL (ref 150–400)
RBC: 5.07 MIL/uL (ref 4.22–5.81)
RDW: 13.5 % (ref 11.5–15.5)
WBC: 7 10*3/uL (ref 4.0–10.5)
nRBC: 0 % (ref 0.0–0.2)

## 2018-09-24 LAB — URINALYSIS, COMPLETE (UACMP) WITH MICROSCOPIC
Bilirubin Urine: NEGATIVE
Glucose, UA: NEGATIVE mg/dL
Hgb urine dipstick: NEGATIVE
Ketones, ur: NEGATIVE mg/dL
Leukocytes,Ua: NEGATIVE
Nitrite: NEGATIVE
Protein, ur: NEGATIVE mg/dL
Specific Gravity, Urine: 1.026 (ref 1.005–1.030)
Squamous Epithelial / LPF: NONE SEEN (ref 0–5)
WBC, UA: NONE SEEN WBC/hpf (ref 0–5)
pH: 6 (ref 5.0–8.0)

## 2018-09-24 LAB — COMPREHENSIVE METABOLIC PANEL
ALT: 27 U/L (ref 0–44)
AST: 23 U/L (ref 15–41)
Albumin: 4 g/dL (ref 3.5–5.0)
Alkaline Phosphatase: 47 U/L (ref 38–126)
Anion gap: 11 (ref 5–15)
BUN: 14 mg/dL (ref 6–20)
CO2: 24 mmol/L (ref 22–32)
Calcium: 8.8 mg/dL — ABNORMAL LOW (ref 8.9–10.3)
Chloride: 104 mmol/L (ref 98–111)
Creatinine, Ser: 0.94 mg/dL (ref 0.61–1.24)
GFR calc Af Amer: >60 mL/min (ref >60)
GFR calc non Af Amer: >60 mL/min (ref >60)
Glucose, Bld: 106 mg/dL — ABNORMAL HIGH (ref 70–99)
Potassium: 4 mmol/L (ref 3.5–5.1)
Sodium: 139 mmol/L (ref 135–145)
Total Bilirubin: 0.6 mg/dL (ref 0.3–1.2)
Total Protein: 7 g/dL (ref 6.5–8.1)

## 2018-09-24 LAB — URINE DRUG SCREEN, QUALITATIVE (ARMC ONLY)
Amphetamines, Ur Screen: NOT DETECTED
Barbiturates, Ur Screen: NOT DETECTED
Benzodiazepine, Ur Scrn: NOT DETECTED
Cannabinoid 50 Ng, Ur ~~LOC~~: POSITIVE — AB
Cocaine Metabolite,Ur ~~LOC~~: POSITIVE — AB
MDMA (Ecstasy)Ur Screen: NOT DETECTED
Methadone Scn, Ur: NOT DETECTED
Opiate, Ur Screen: POSITIVE — AB
Phencyclidine (PCP) Ur S: NOT DETECTED
Tricyclic, Ur Screen: NOT DETECTED

## 2018-09-24 LAB — LIPASE, BLOOD: Lipase: 46 U/L (ref 11–51)

## 2018-09-24 LAB — C-REACTIVE PROTEIN: CRP: <0.8 mg/dL (ref <1.0)

## 2018-09-24 MED ORDER — ALUM & MAG HYDROXIDE-SIMETH 200-200-20 MG/5ML PO SUSP
30.0000 mL | Freq: Once | ORAL | Status: AC
  Administered 2018-09-24: 30 mL via ORAL
  Filled 2018-09-24: qty 30

## 2018-09-24 MED ORDER — ONDANSETRON HCL 4 MG/2ML IJ SOLN
4.0000 mg | Freq: Once | INTRAMUSCULAR | Status: AC
  Administered 2018-09-24: 4 mg via INTRAVENOUS
  Filled 2018-09-24: qty 2

## 2018-09-24 MED ORDER — MORPHINE SULFATE (PF) 4 MG/ML IV SOLN
4.0000 mg | Freq: Once | INTRAVENOUS | Status: AC
  Administered 2018-09-24: 4 mg via INTRAVENOUS
  Filled 2018-09-24: qty 1

## 2018-09-24 MED ORDER — METRONIDAZOLE IN NACL 5-0.79 MG/ML-% IV SOLN
500.0000 mg | Freq: Three times a day (TID) | INTRAVENOUS | Status: DC
  Administered 2018-09-24 – 2018-09-26 (×6): 500 mg via INTRAVENOUS
  Filled 2018-09-24 (×7): qty 100

## 2018-09-24 MED ORDER — ACETAMINOPHEN 325 MG PO TABS: 650.0000 mg | ORAL_TABLET | Freq: Four times a day (QID) | ORAL | Status: DC | PRN

## 2018-09-24 MED ORDER — PANTOPRAZOLE SODIUM 40 MG IV SOLR
40.0000 mg | Freq: Two times a day (BID) | INTRAVENOUS | Status: DC
  Administered 2018-09-24 – 2018-09-26 (×5): 40 mg via INTRAVENOUS
  Filled 2018-09-24 (×5): qty 40

## 2018-09-24 MED ORDER — ENOXAPARIN SODIUM 40 MG/0.4ML ~~LOC~~ SOLN
40.0000 mg | SUBCUTANEOUS | Status: DC
  Administered 2018-09-24 – 2018-09-25 (×2): 40 mg via SUBCUTANEOUS
  Filled 2018-09-24 (×2): qty 0.4

## 2018-09-24 MED ORDER — IOHEXOL 300 MG/ML  SOLN
100.0000 mL | Freq: Once | INTRAMUSCULAR | Status: AC | PRN
  Administered 2018-09-24: 100 mL via INTRAVENOUS

## 2018-09-24 MED ORDER — ACETAMINOPHEN 650 MG RE SUPP: 650.0000 mg | Freq: Four times a day (QID) | RECTAL | Status: DC | PRN

## 2018-09-24 MED ORDER — CIPROFLOXACIN IN D5W 400 MG/200ML IV SOLN
400.0000 mg | Freq: Two times a day (BID) | INTRAVENOUS | Status: DC
  Administered 2018-09-24 – 2018-09-26 (×4): 400 mg via INTRAVENOUS
  Filled 2018-09-24 (×5): qty 200

## 2018-09-24 MED ORDER — DICYCLOMINE HCL 20 MG PO TABS
20.0000 mg | ORAL_TABLET | Freq: Three times a day (TID) | ORAL | Status: DC | PRN
  Administered 2018-09-24 (×2): 20 mg via ORAL
  Filled 2018-09-24 (×4): qty 1

## 2018-09-24 MED ORDER — HYDROMORPHONE HCL 1 MG/ML IJ SOLN
1.0000 mg | Freq: Once | INTRAMUSCULAR | Status: AC
  Administered 2018-09-24: 1 mg via INTRAVENOUS
  Filled 2018-09-24: qty 1

## 2018-09-24 MED ORDER — FAMOTIDINE IN NACL 20-0.9 MG/50ML-% IV SOLN
20.0000 mg | Freq: Once | INTRAVENOUS | Status: AC
  Administered 2018-09-24: 20 mg via INTRAVENOUS
  Filled 2018-09-24: qty 50

## 2018-09-24 MED ORDER — OXYCODONE-ACETAMINOPHEN 5-325 MG PO TABS
1.0000 | ORAL_TABLET | Freq: Four times a day (QID) | ORAL | Status: DC | PRN
  Administered 2018-09-24 – 2018-09-26 (×4): 1 via ORAL
  Filled 2018-09-24 (×4): qty 1

## 2018-09-24 MED ORDER — SODIUM CHLORIDE 0.9 % IV SOLN
INTRAVENOUS | Status: DC
  Administered 2018-09-24 – 2018-09-25 (×2): via INTRAVENOUS

## 2018-09-24 MED ORDER — ONDANSETRON HCL 4 MG/2ML IJ SOLN
4.0000 mg | Freq: Four times a day (QID) | INTRAMUSCULAR | Status: DC | PRN
  Administered 2018-09-25 (×2): 4 mg via INTRAVENOUS
  Filled 2018-09-24 (×2): qty 2

## 2018-09-24 MED ORDER — LIDOCAINE VISCOUS HCL 2 % MT SOLN
15.0000 mL | Freq: Once | OROMUCOSAL | Status: AC
  Administered 2018-09-24: 15 mL via ORAL
  Filled 2018-09-24: qty 15

## 2018-09-24 MED ORDER — ONDANSETRON HCL 4 MG PO TABS: 4.0000 mg | ORAL_TABLET | Freq: Four times a day (QID) | ORAL | Status: DC | PRN

## 2018-09-24 MED ORDER — NICOTINE 21 MG/24HR TD PT24
21.0000 mg | MEDICATED_PATCH | Freq: Every day | TRANSDERMAL | Status: DC
  Administered 2018-09-24 – 2018-09-26 (×3): 21 mg via TRANSDERMAL
  Filled 2018-09-24 (×3): qty 1

## 2018-09-24 MED ORDER — MORPHINE SULFATE (PF) 2 MG/ML IV SOLN
2.0000 mg | INTRAVENOUS | Status: DC | PRN
  Administered 2018-09-24 – 2018-09-25 (×5): 2 mg via INTRAVENOUS
  Filled 2018-09-24 (×5): qty 1

## 2018-09-24 NOTE — Consult Note (Signed)
Jonathon Bellows , MD [address], [place], [place], [place], [address] [address], [place], [place], [place], [address] Phone: [phone]  Fax: [phone]  Consultation  Referring Provider:     Dr Posey Pronto Primary Care Physician:  Patient, No Pcp Per Primary Gastroenterologist:  None          Reason for Consultation:     Abdominal pain   Date of Admission:  09/24/2018 Date of Consultation:  09/24/2018         HPI:   Wesley Hart is a 33 y.o. male presented to the ER with nausea,vomiting and abdominal pain. Pain began all of a sudden yesterday evening, didn't get better , hence came into the hospital. Generalizd, non radiating .   Similar presentation in 04/2018 at the ER -attributed to issues with lactose intolerance . CT scan demonstrated enteritis - was suggested outpatient GI follow up but didn't do so .   On this presentation he also underwent a CT scan of the abdomen which showed features of enteritis- particularly affecting the distal terminal ileum,similr in presentation to scan in 04/2018.  Says he still has the pain now but also developed diarrhea, he is a smoker, no joint pains, skin rashes, fevers, vomiting earlier today. No NSAID use. No family history IBD. Pain always generalized, continuous and the same nature.     Past Medical History:  Diagnosis Date  . Lactose intolerance   . Stomach ulcer     Past Surgical History:  Procedure Laterality Date  . FRACTURE SURGERY  2005  . KNEE ARTHROSCOPY Left 09/22/2016   Procedure: ARTHROSCOPY KNEE;  Surgeon: Corky Mull, MD;  Location: [facility] ORS;  Service: Orthopedics;  Laterality: Left;  . ORIF TIBIA PLATEAU Left 09/22/2016   Procedure: OPEN REDUCTION INTERNAL FIXATION (ORIF) TIBIAL PLATEAU;  Surgeon: Corky Mull, MD;  Location: [facility] ORS;  Service: Orthopedics;  Laterality: Left;    Prior to Admission medications   Medication Sig Start Date End Date Taking? Authorizing Provider  dicyclomine (BENTYL) 20 MG tablet Take  1 tablet (20 mg total) by mouth 3 (three) times daily as needed for spasms. 05/14/18 05/14/19  Orbie Pyo, MD  ibuprofen (ADVIL,MOTRIN) 600 MG tablet Take 1 tablet (600 mg total) by mouth every 8 (eight) hours as needed. Patient not taking: Reported on 05/14/2018 02/01/18   Sable Feil, PA-C  meloxicam (MOBIC) 15 MG tablet Take 15 mg by mouth daily.     [provider]  mupirocin ointment (BACTROBAN) 2 % Apply 1 application topically 2 (two) times daily. Patient not taking: Reported on 05/14/2018 12/23/17   Versie Starks, PA-C  ondansetron (ZOFRAN) 4 MG tablet Take 1 tablet (4 mg total) by mouth daily as needed. Patient not taking: Reported on 09/24/2018 05/14/18   Schaevitz, Randall An, MD  traMADol (ULTRAM) 50 MG tablet Take 1 tablet (50 mg total) by mouth every 12 (twelve) hours as needed. Patient not taking: Reported on 05/14/2018 02/01/18   Sable Feil, PA-C    Family History  Problem Relation Age of Onset  . Hypertension Mother      Social History   Tobacco Use  . Smoking status: Current Every Day Smoker    Packs/day: 0.25    Years: 18.00    Pack years: 4.50    Types: Cigarettes  . Smokeless tobacco: Never Used  Substance Use Topics  . Alcohol use: Yes    Comment: 1-2 BEERS weekly  . Drug use: Yes  Types: Marijuana    Comment: PT DENIES THIS DURING PHONE INTERVIEW (09-20-16)    Allergies as of 09/24/2018 - Review Complete 09/24/2018  Allergen Reaction Noted  . Mushroom extract complex Anaphylaxis 09/19/2016  . Lactose intolerance (gi) Nausea And Vomiting 10/13/2014  . Normal saline [sodium chloride] Other (See Comments) 05/15/2018  . Penicillins Nausea And Vomiting 09/19/2016  . Prednisone Itching and Other (See Comments) 12/06/2014    Review of Systems:    All systems reviewed and negative except where noted in HPI.   Physical Exam:  Vital signs in last 24 hours: Temp:  [98 F (36.7 C)-98.3 F (36.8 C)] 98 F (36.7 C) (05/04  1205) Pulse Rate:  [59-71] 59 (05/04 1205) Resp:  [13-23] 18 (05/04 1205) BP: (132-166)/(75-105) 143/75 (05/04 1205) SpO2:  [98 %-100 %] 100 % (05/04 1041) Weight:  [106.6 kg] 106.6 kg (05/04 0746)   General:   Pleasant, cooperative in NAD Head:  Normocephalic and atraumatic. Eyes:   No icterus.   Conjunctiva pink. PERRLA. Ears:  Normal auditory acuity. Neck:  Supple; no masses or thyroidomegaly Lungs: Respirations even and unlabored. Lungs clear to auscultation bilaterally.   No wheezes, crackles, or rhonchi.  Heart:  Regular rate and rhythm;  Without murmur, clicks, rubs or gallops Abdomen:  Soft, nondistended, mild generalized tenderness. Normal bowel sounds. No appreciable masses or hepatomegaly.  No rebound or guarding.  Neurologic:  Alert and oriented x3;  grossly normal neurologically. Skin:  Intact without significant lesions or rashes. Cervical Nodes:  No significant cervical adenopathy. Psych:  Alert and cooperative. Normal affect.  LAB RESULTS: Recent Labs    09/24/18 0749  WBC 7.0  HGB 14.7  HCT 44.0  PLT 235   BMET Recent Labs    09/24/18 0749  NA 139  K 4.0  CL 104  CO2 24  GLUCOSE 106*  BUN 14  CREATININE 0.94  CALCIUM 8.8*   LFT Recent Labs    09/24/18 0749  PROT 7.0  ALBUMIN 4.0  AST 23  ALT 27  ALKPHOS 47  BILITOT 0.6   PT/INR No results for input(s): LABPROT, INR in the last 72 hours.  STUDIES: Ct Abdomen Pelvis W Contrast  Result Date: 09/24/2018 CLINICAL DATA:  Acute generalized abdominal pain. EXAM: CT ABDOMEN AND PELVIS WITH CONTRAST TECHNIQUE: Multidetector CT imaging of the abdomen and pelvis was performed using the standard protocol following bolus administration of intravenous contrast. CONTRAST:  173mL OMNIPAQUE IOHEXOL 300 MG/ML  SOLN COMPARISON:  05/14/2018; 09/14/2016 FINDINGS: Lower chest: Limited visualization of the lower thorax demonstrates minimal dependent subsegmental atelectasis, most conspicuous within the right lower  lobe. No focal airspace opacities. No pleural effusion. Normal heart size.  No pericardial effusion. Hepatobiliary: Normal hepatic contour. There is a minimal amount focal fatty infiltration adjacent to the fissure for the ligamentum teres. No discrete hepatic lesions. Normal appearance of the gallbladder given degree distention. No radiopaque gallstones. No intra or extrahepatic biliary dilatation. No ascites. Pancreas: Normal appearance of the pancreas. Spleen: Normal appearance of the spleen. Adrenals/Urinary Tract: There is symmetric enhancement of the bilateral kidneys. Note is made of 2 adjacent punctate (approximately 4 mm) nonobstructing stones within the interpolar aspect the right kidney (images 31 and 32, series 2). Note is made of several bilateral punctate hypoattenuating renal lesions, right greater than left, too small to accurately characterize of favored to represent renal cysts. No urine obstruction or perinephric stranding. Normal appearance the bilateral adrenal glands. Normal appearance of the urinary bladder given degree distention.  Stomach/Bowel: Mild fluid distension of the tip of the cecum without associated adjacent inflammatory change. Normal appearance of the retrocecal appendix (best seen on coronal images 37 through 44, series 5). Potential mild circumferential wall thickening involving several loops of terminal ileum (representative coronal images 35 and 38, series 5), similar to abdominal CT performed 05/14/2018 and again without associated upstream dilatation to suggest enteric obstruction. The bowel otherwise in appears normal in course and caliber. No pneumoperitoneum, pneumatosis or portal venous gas. Small hiatal hernia. Vascular/Lymphatic: Normal caliber the abdominal aorta. The major branch vessels of the abdominal aorta appear patent on this non CTA examination. No bulky retroperitoneal, mesenteric, pelvic or inguinal lymphadenopathy. Reproductive: Normal appearance the  prostate gland. There is a trace amount of presumably reactive free fluid is seen with the pelvic cul-de-sac. Other: Regional soft tissues appear normal. Musculoskeletal: No acute or aggressive osseous abnormalities. Normal appearance of the bilateral SI joints without CT evidence of sacroiliitis. Note is made of a small right-sided os acetabuli. IMPRESSION: 1. Mild circumferential wall thickening involving several loops of distal small bowel, similar to the 04/2018 examination and without evidence of enteric obstruction. Findings are nonspecific though could be seen in the setting of an inflammatory enteritis (such as Crohn's colitis). 2. Otherwise, no explanation for patient's diffuse abdominal pain. Specifically, normal appearance of the appendix. No evidence of enteric or urinary obstruction. 3. Nonobstructing right-sided nephrolithiasis. Electronically Signed   By: Sandi Mariscal M.D.   On: 09/24/2018 10:37   Dg Abd 2 Views  Result Date: 09/24/2018 CLINICAL DATA:  Abdominal pain EXAM: ABDOMEN - 2 VIEW COMPARISON:  05/14/2018 abdominal CT FINDINGS: There are a few air-fluid levels in the right abdomen and mildly distended small bowel loops in the left abdomen. 2 right renal calculi measuring up to 5 mm. Negative for pneumoperitoneum. Lung bases are clear. IMPRESSION: 1. Mildly distended small bowel loops with scattered fluid levels. Enteritis or early partial bowel obstruction could have this appearance. 2. Right nephrolithiasis. Electronically Signed   By: Monte Fantasia M.D.   On: 09/24/2018 08:09      Impression / Plan:   Wesley Hart is a 33 y.o. y/o male admitted today with the second presentation of abdominal pain , imaging suggestive of enteritis. Differentials include infection but Crohn's disease also needs to be ruled out.     Plan  1. Continue Ciprofloxacin and flagyl 2. Once vomiting resolves- can plan for colonoscopy with biopsies of the terminal ileum.Likely Wednesday or Thursday  3. No NSAID's/smoking  4. Check baseline CRP, check urine for drug use , check stool for infection 5. Commence on clears when he is able to tolerate 6. PPI  I have discussed alternative options, risks & benefits,  which include, but are not limited to, bleeding, infection, perforation,respiratory complication & drug reaction.  The patient agrees with this plan & written consent will be obtained.     Thank you for involving me in the care of this patient.      LOS: 0 days   Jonathon Bellows, MD  09/24/2018, 1:48 PM

## 2018-09-24 NOTE — ED Provider Notes (Signed)
[facility] Emergency Department Provider Note       Time seen: ----------------------------------------- 8:02 AM on 09/24/2018 -----------------------------------------   I have reviewed the triage vital signs and the nursing notes.  HISTORY   Chief Complaint Abdominal Pain    HPI Wesley Hart is a 33 y.o. male with a history of lactose intolerance and stomach ulceration who presents to the ED for severe left-sided abdominal pain as well as nausea and vomiting.  Patient describes the pain is 10 out of 10.  This is happened in the past when he was noncompliant with his lactose intolerant diet.  He denies fevers, chills or other complaints.  Past Medical History:  Diagnosis Date  . Lactose intolerance   . Stomach ulcer     There are no active problems to display for this patient.   Past Surgical History:  Procedure Laterality Date  . FRACTURE SURGERY  2005  . KNEE ARTHROSCOPY Left 09/22/2016   Procedure: ARTHROSCOPY KNEE;  Surgeon: Corky Mull, MD;  Location: [facility] ORS;  Service: Orthopedics;  Laterality: Left;  . ORIF TIBIA PLATEAU Left 09/22/2016   Procedure: OPEN REDUCTION INTERNAL FIXATION (ORIF) TIBIAL PLATEAU;  Surgeon: Corky Mull, MD;  Location: [facility] ORS;  Service: Orthopedics;  Laterality: Left;    Allergies Mushroom extract complex; Lactose intolerance (gi); Normal saline [sodium chloride]; Penicillins; and Prednisone  Social History Social History   Tobacco Use  . Smoking status: Current Every Day Smoker    Packs/day: 0.25    Years: 18.00    Pack years: 4.50    Types: Cigarettes  . Smokeless tobacco: Never Used  Substance Use Topics  . Alcohol use: Yes    Comment: 1-2 BEERS weekly  . Drug use: Yes    Types: Marijuana    Comment: PT DENIES THIS DURING PHONE INTERVIEW (09-20-16)   Review of Systems Constitutional: Negative for fever. Cardiovascular: Negative for chest pain. Respiratory: Negative for shortness of  breath. Gastrointestinal: Positive for abdominal pain, nausea vomiting Musculoskeletal: Negative for back pain. Skin: Negative for rash. Neurological: Negative for headaches, focal weakness or numbness.  All systems negative/normal/unremarkable except as stated in the HPI  ____________________________________________   PHYSICAL EXAM:  VITAL SIGNS: ED Triage Vitals  Enc Vitals Group     BP --      Pulse --      Resp --      Temp --      Temp src --      SpO2 --      Weight 09/24/18 0746 235 lb (106.6 kg)     Height 09/24/18 0746 5\' 10"  (1.778 m)     Head Circumference --      Peak Flow --      Pain Score 09/24/18 0745 10     Pain Loc --      Pain Edu? --      Excl. in [place]? --    Constitutional: Alert and oriented.  Mild distress from pain Eyes: Conjunctivae are normal. Normal extraocular movements. ENT      Head: Normocephalic and atraumatic.      Nose: No congestion/rhinnorhea.      Mouth/Throat: Mucous membranes are moist.      Neck: No stridor. Cardiovascular: Normal rate, regular rhythm. No murmurs, rubs, or gallops. Respiratory: Normal respiratory effort without tachypnea nor retractions. Breath sounds are clear and equal bilaterally. No wheezes/rales/rhonchi. Gastrointestinal: Left-sided abdominal tenderness, no rebound or guarding.  Normal bowel sounds. Musculoskeletal: Nontender with normal  range of motion in extremities. No lower extremity tenderness nor edema. Neurologic:  Normal speech and language. No gross focal neurologic deficits are appreciated.  Skin:  Skin is warm, dry and intact. No rash noted. Psychiatric: Mood and affect are normal. Speech and behavior are normal.  ____________________________________________  ED COURSE:  As part of my medical decision making, I reviewed the following data within the Hall History obtained from family if available, nursing notes, old chart and ekg, as well as notes from prior ED  visits. Patient presented for abdominal pain as well as nausea and vomiting, we will assess with labs and imaging as indicated at this time.   Procedures  Wesley Hart was evaluated in Emergency Department on 09/24/2018 for the symptoms described in the history of present illness. He was evaluated in the context of the global COVID-19 pandemic, which necessitated consideration that the patient might be at risk for infection with the SARS-CoV-2 virus that causes COVID-19. Institutional protocols and algorithms that pertain to the evaluation of patients at risk for COVID-19 are in a state of rapid change based on information released by regulatory bodies including the CDC and federal and state organizations. These policies and algorithms were followed during the patient's care in the ED.  ____________________________________________   LABS (pertinent positives/negatives)  Labs Reviewed  COMPREHENSIVE METABOLIC PANEL - Abnormal; Notable for the following components:      Result Value   Glucose, Bld 106 (*)    Calcium 8.8 (*)    All other components within normal limits  URINALYSIS, COMPLETE (UACMP) WITH MICROSCOPIC - Abnormal; Notable for the following components:   Color, Urine YELLOW (*)    APPearance CLEAR (*)    Bacteria, UA RARE (*)    All other components within normal limits  CBC WITH DIFFERENTIAL/PLATELET  LIPASE, BLOOD    RADIOLOGY Images were viewed by me  Abdomen 2 view/CT abd/pelvis IMPRESSION: 1. Mild circumferential wall thickening involving several loops of distal small bowel, similar to the 04/2018 examination and without evidence of enteric obstruction. Findings are nonspecific though could be seen in the setting of an inflammatory enteritis (such as Crohn's colitis). 2. Otherwise, no explanation for patient's diffuse abdominal pain. Specifically, normal appearance of the appendix. No evidence of enteric or urinary obstruction. 3. Nonobstructing right-sided  nephrolithiasis.  ____________________________________________   DIFFERENTIAL DIAGNOSIS   Lactose intolerance, gastritis, peptic ulcer disease, dehydration, electrolyte abnormality, gastroenteritis  FINAL ASSESSMENT AND PLAN  Abdominal pain, vomiting   Plan: The patient had presented for abdominal pain and vomiting. Patient's labs are unremarkable. Patient's imaging resembled inflammatory bowel disorder such as Crohn's disease.  Patient is having intractable pain.  We gave him IV morphine, Dilaudid, Pepcid and a GI cocktail without any significant improvement.  I will discuss with the hospitalist for admission.   Laurence Aly, MD    Note: This note was generated in part or whole with voice recognition software. Voice recognition is usually quite accurate but there are transcription errors that can and very often do occur. I apologize for any typographical errors that were not detected and corrected.     Earleen Newport, MD 09/24/18 1058

## 2018-09-24 NOTE — ED Notes (Signed)
ED TO INPATIENT HANDOFF REPORT  ED Nurse Name and Phone #: Terri Piedra [phone]  S Name/Age/Gender Wesley Hart 33 y.o. male Room/Bed: ED26A/ED26A  Code Status   Code Status: Not on file  Home/SNF/Other Home Patient oriented to: self, place, time and situation Is this baseline? Yes   Triage Complete: Triage complete  Chief Complaint abd pain  Triage Note Patient arrived via [place] with reports of  severe abdominal pain , nausea, and vomiting,Rates mid abdominal pain as a 10/10. Stated this has happened before "Due to being Lactose intolerant".   Allergies Allergies  Allergen Reactions  . Mushroom Extract Complex Anaphylaxis  . Lactose Intolerance (Gi) Nausea And Vomiting  . Normal Saline [Sodium Chloride] Other (See Comments)    Testicle swelling  . Penicillins Nausea And Vomiting    Has patient had a PCN reaction causing immediate rash, facial/tongue/throat swelling, SOB or lightheadedness with hypotension: No Has patient had a PCN reaction causing severe rash involving mucus membranes or skin necrosis: No Has patient had a PCN reaction that required hospitalization No Has patient had a PCN reaction occurring within the last 10 years: Yes If all of the above answers are "NO", then may proceed with Cephalosporin use.   . Prednisone Itching and Other (See Comments)    Swelling of testical    Level of Care/Admitting Diagnosis ED Disposition    ED Disposition Condition [facility] Area: [place] [[id number]]  Level of Care: Med-Surg [16]  Covid Evaluation: N/A  Diagnosis: Abdominal pain [[id number]]  Admitting Physician: Dustin Flock [[id number]]  Attending Physician: Dustin Flock [[id number]]  Estimated length of stay: past midnight tomorrow  Certification:: I certify this patient will need inpatient services for at least 2 midnights  PT Class (Do Not Modify): Inpatient [101]  PT Acc Code (Do Not Modify): Private [1]        B Medical/Surgery History Past Medical History:  Diagnosis Date  . Lactose intolerance   . Stomach ulcer    Past Surgical History:  Procedure Laterality Date  . FRACTURE SURGERY  2005  . KNEE ARTHROSCOPY Left 09/22/2016   Procedure: ARTHROSCOPY KNEE;  Surgeon: Corky Mull, MD;  Location: [facility] ORS;  Service: Orthopedics;  Laterality: Left;  . ORIF TIBIA PLATEAU Left 09/22/2016   Procedure: OPEN REDUCTION INTERNAL FIXATION (ORIF) TIBIAL PLATEAU;  Surgeon: Corky Mull, MD;  Location: [facility] ORS;  Service: Orthopedics;  Laterality: Left;     A IV Location/Drains/Wounds Patient Lines/Drains/Airways Status   Active Line/Drains/Airways    Name:   Placement date:   Placement time:   Site:   Days:   Peripheral IV 09/24/18 Left Antecubital   09/24/18    -    Antecubital   less than 1   Incision (Closed) 09/22/16 Knee Left   09/22/16    1030     732          Intake/Output Last 24 hours  Intake/Output Summary (Last 24 hours) at 09/24/2018 1125 Last data filed at 09/24/2018 0856 Gross per 24 hour  Intake 50 ml  Output -  Net 50 ml    Labs/Imaging Results for orders placed or performed during the hospital encounter of 09/24/18 (from the past 48 hour(s))  CBC with Differential     Status: None   Collection Time: 09/24/18  7:49 AM  Result Value Ref Range   WBC 7.0 4.0 - 10.5 K/uL   RBC 5.07 4.22 - 5.81 MIL/uL   Hemoglobin  14.7 13.0 - 17.0 g/dL   HCT 44.0 39.0 - 52.0 %   MCV 86.8 80.0 - 100.0 fL   MCH 29.0 26.0 - 34.0 pg   MCHC 33.4 30.0 - 36.0 g/dL   RDW 13.5 11.5 - 15.5 %   Platelets 235 150 - 400 K/uL   nRBC 0.0 0.0 - 0.2 %   Neutrophils Relative % 62 %   Neutro Abs 4.4 1.7 - 7.7 K/uL   Lymphocytes Relative 30 %   Lymphs Abs 2.1 0.7 - 4.0 K/uL   Monocytes Relative 6 %   Monocytes Absolute 0.4 0.1 - 1.0 K/uL   Eosinophils Relative 2 %   Eosinophils Absolute 0.1 0.0 - 0.5 K/uL   Basophils Relative 0 %   Basophils Absolute 0.0 0.0 - 0.1 K/uL   Immature Granulocytes 0 %    Abs Immature Granulocytes 0.03 0.00 - 0.07 K/uL    Comment: Performed at [facility], [address] [place], [place] [address]  Comprehensive metabolic panel     Status: Abnormal   Collection Time: 09/24/18  7:49 AM  Result Value Ref Range   Sodium 139 135 - 145 mmol/L   Potassium 4.0 3.5 - 5.1 mmol/L   Chloride 104 98 - 111 mmol/L   CO2 24 22 - 32 mmol/L   Glucose, Bld 106 (H) 70 - 99 mg/dL   BUN 14 6 - 20 mg/dL   Creatinine, Ser 0.94 0.61 - 1.24 mg/dL   Calcium 8.8 (L) 8.9 - 10.3 mg/dL   Total Protein 7.0 6.5 - 8.1 g/dL   Albumin 4.0 3.5 - 5.0 g/dL   AST 23 15 - 41 U/L   ALT 27 0 - 44 U/L   Alkaline Phosphatase 47 38 - 126 U/L   Total Bilirubin 0.6 0.3 - 1.2 mg/dL   GFR calc non Af Amer >60 >60 mL/min   GFR calc Af Amer >60 >60 mL/min   Anion gap 11 5 - 15    Comment: Performed at [facility], [address] [place], [place] [address]  Lipase, blood     Status: None   Collection Time: 09/24/18  7:49 AM  Result Value Ref Range   Lipase 46 11 - 51 U/L    Comment: Performed at [facility], [address] [place], [place] [address]  Urinalysis, Complete w Microscopic     Status: Abnormal   Collection Time: 09/24/18  8:23 AM  Result Value Ref Range   Color, Urine YELLOW (A) YELLOW   APPearance CLEAR (A) CLEAR   Specific Gravity, Urine 1.026 1.005 - 1.030   pH 6.0 5.0 - 8.0   Glucose, UA NEGATIVE NEGATIVE mg/dL   Hgb urine dipstick NEGATIVE NEGATIVE   Bilirubin Urine NEGATIVE NEGATIVE   Ketones, ur NEGATIVE NEGATIVE mg/dL   Protein, ur NEGATIVE NEGATIVE mg/dL   Nitrite NEGATIVE NEGATIVE   Leukocytes,Ua NEGATIVE NEGATIVE   RBC / HPF 0-5 0 - 5 RBC/hpf   WBC, UA NONE SEEN 0 - 5 WBC/hpf   Bacteria, UA RARE (A) NONE SEEN   Squamous Epithelial / LPF NONE SEEN 0 - 5   Mucus PRESENT     Comment: Performed at [facility], [address], [place], [place] [address]   Ct Abdomen Pelvis W Contrast  Result Date: 09/24/2018 CLINICAL  DATA:  Acute generalized abdominal pain. EXAM: CT ABDOMEN AND PELVIS WITH CONTRAST TECHNIQUE: Multidetector CT imaging of the abdomen and pelvis was performed using the standard protocol following  bolus administration of intravenous contrast. CONTRAST:  131mL OMNIPAQUE IOHEXOL 300 MG/ML  SOLN COMPARISON:  05/14/2018; 09/14/2016 FINDINGS: Lower chest: Limited visualization of the lower thorax demonstrates minimal dependent subsegmental atelectasis, most conspicuous within the right lower lobe. No focal airspace opacities. No pleural effusion. Normal heart size.  No pericardial effusion. Hepatobiliary: Normal hepatic contour. There is a minimal amount focal fatty infiltration adjacent to the fissure for the ligamentum teres. No discrete hepatic lesions. Normal appearance of the gallbladder given degree distention. No radiopaque gallstones. No intra or extrahepatic biliary dilatation. No ascites. Pancreas: Normal appearance of the pancreas. Spleen: Normal appearance of the spleen. Adrenals/Urinary Tract: There is symmetric enhancement of the bilateral kidneys. Note is made of 2 adjacent punctate (approximately 4 mm) nonobstructing stones within the interpolar aspect the right kidney (images 31 and 32, series 2). Note is made of several bilateral punctate hypoattenuating renal lesions, right greater than left, too small to accurately characterize of favored to represent renal cysts. No urine obstruction or perinephric stranding. Normal appearance the bilateral adrenal glands. Normal appearance of the urinary bladder given degree distention. Stomach/Bowel: Mild fluid distension of the tip of the cecum without associated adjacent inflammatory change. Normal appearance of the retrocecal appendix (best seen on coronal images 37 through 44, series 5). Potential mild circumferential wall thickening involving several loops of terminal ileum (representative coronal images 35 and 38, series 5), similar to abdominal CT performed  05/14/2018 and again without associated upstream dilatation to suggest enteric obstruction. The bowel otherwise in appears normal in course and caliber. No pneumoperitoneum, pneumatosis or portal venous gas. Small hiatal hernia. Vascular/Lymphatic: Normal caliber the abdominal aorta. The major branch vessels of the abdominal aorta appear patent on this non CTA examination. No bulky retroperitoneal, mesenteric, pelvic or inguinal lymphadenopathy. Reproductive: Normal appearance the prostate gland. There is a trace amount of presumably reactive free fluid is seen with the pelvic cul-de-sac. Other: Regional soft tissues appear normal. Musculoskeletal: No acute or aggressive osseous abnormalities. Normal appearance of the bilateral SI joints without CT evidence of sacroiliitis. Note is made of a small right-sided os acetabuli. IMPRESSION: 1. Mild circumferential wall thickening involving several loops of distal small bowel, similar to the 04/2018 examination and without evidence of enteric obstruction. Findings are nonspecific though could be seen in the setting of an inflammatory enteritis (such as Crohn's colitis). 2. Otherwise, no explanation for patient's diffuse abdominal pain. Specifically, normal appearance of the appendix. No evidence of enteric or urinary obstruction. 3. Nonobstructing right-sided nephrolithiasis. Electronically Signed   By: Sandi Mariscal M.D.   On: 09/24/2018 10:37   Dg Abd 2 Views  Result Date: 09/24/2018 CLINICAL DATA:  Abdominal pain EXAM: ABDOMEN - 2 VIEW COMPARISON:  05/14/2018 abdominal CT FINDINGS: There are a few air-fluid levels in the right abdomen and mildly distended small bowel loops in the left abdomen. 2 right renal calculi measuring up to 5 mm. Negative for pneumoperitoneum. Lung bases are clear. IMPRESSION: 1. Mildly distended small bowel loops with scattered fluid levels. Enteritis or early partial bowel obstruction could have this appearance. 2. Right nephrolithiasis.  Electronically Signed   By: Monte Fantasia M.D.   On: 09/24/2018 08:09    Pending Labs [employer] (From admission, onward)    Start     Ordered   Signed and Held  HIV antibody (Routine Testing)  Once,   R     Signed and Held   Signed and Held  CBC  (enoxaparin (LOVENOX)    CrCl >/=  30 ml/min)  Once,   R    Comments:  Baseline for enoxaparin therapy IF NOT ALREADY DRAWN.  Notify MD if PLT < 100 K.    Signed and Held   Signed and Held  Creatinine, serum  (enoxaparin (LOVENOX)    CrCl >/= 30 ml/min)  Once,   R    Comments:  Baseline for enoxaparin therapy IF NOT ALREADY DRAWN.    Signed and Held   Signed and Held  Creatinine, serum  (enoxaparin (LOVENOX)    CrCl >/= 30 ml/min)  Weekly,   R    Comments:  while on enoxaparin therapy    Signed and Held   Signed and Held  CBC  Tomorrow morning,   R     Signed and Held   Signed and Held  Basic metabolic panel  Tomorrow morning,   R     Signed and Held          Vitals/Pain Today's Vitals   09/24/18 0900 09/24/18 0915 09/24/18 0930 09/24/18 1041  BP: 138/87  (!) 166/105 132/76  Pulse: 63   71  Resp:    20  Temp:    98.3 F (36.8 C)  TempSrc:    Oral  SpO2: 98%   100%  Weight:      Height:      PainSc:  7   7     Isolation Precautions No active isolations  Medications Medications  ciprofloxacin (CIPRO) IVPB 400 mg (has no administration in time range)  metroNIDAZOLE (FLAGYL) IVPB 500 mg (has no administration in time range)  morphine 4 MG/ML injection 4 mg (4 mg Intravenous Given 09/24/18 0750)  ondansetron (ZOFRAN) injection 4 mg (4 mg Intravenous Given 09/24/18 0750)  famotidine (PEPCID) IVPB 20 mg premix (0 mg Intravenous Stopped 09/24/18 0856)  alum & mag hydroxide-simeth (MAALOX/MYLANTA) 200-200-20 MG/5ML suspension 30 mL (30 mLs Oral Given 09/24/18 0918)    And  lidocaine (XYLOCAINE) 2 % viscous mouth solution 15 mL (15 mLs Oral Given 09/24/18 0918)  HYDROmorphone (DILAUDID) injection 1 mg (1 mg Intravenous Given  09/24/18 1038)  iohexol (OMNIPAQUE) 300 MG/ML solution 100 mL (100 mLs Intravenous Contrast Given 09/24/18 1020)    Mobility walks Low fall risk   Focused Assessments GI   R Recommendations: See Admitting Provider Note  Report given to:   Additional Notes:

## 2018-09-24 NOTE — ED Triage Notes (Signed)
Patient arrived via [place] with reports of  severe abdominal pain , nausea, and vomiting,Rates mid abdominal pain as a 10/10. Stated this has happened before "Due to being Lactose intolerant".

## 2018-09-24 NOTE — H&P (Signed)
[facility] NAME: [place]    MR#:  [id number]  DATE OF BIRTH:  05/01/1986  DATE OF ADMISSION:  09/24/2018  PRIMARY CARE PHYSICIAN: Patient, No Pcp Per   REQUESTING/REFERRING PHYSICIAN: Earleen Newport, MD  CHIEF COMPLAINT:   Chief Complaint  Patient presents with  . Abdominal Pain    HISTORY OF PRESENT ILLNESS: [place]  is a 33 y.o. male with a known history of lactose intolerance and stomach ulcer who presents with abdominal pain since 6 AM this morning.  He describes it as sharp epigastric pain.  Patient states that for months he has been having this pain on and off.  He was evaluated in the ER CT scan showed possible focal colitis.  He also states that he had some diarrhea since this morning.  Denies any blood in the stool.  Denies any fevers or chills.      PAST MEDICAL HISTORY:   Past Medical History:  Diagnosis Date  . Lactose intolerance   . Stomach ulcer     PAST SURGICAL HISTORY:  Past Surgical History:  Procedure Laterality Date  . FRACTURE SURGERY  2005  . KNEE ARTHROSCOPY Left 09/22/2016   Procedure: ARTHROSCOPY KNEE;  Surgeon: Corky Mull, MD;  Location: [facility] ORS;  Service: Orthopedics;  Laterality: Left;  . ORIF TIBIA PLATEAU Left 09/22/2016   Procedure: OPEN REDUCTION INTERNAL FIXATION (ORIF) TIBIAL PLATEAU;  Surgeon: Corky Mull, MD;  Location: [facility] ORS;  Service: Orthopedics;  Laterality: Left;    SOCIAL HISTORY:  Social History   Tobacco Use  . Smoking status: Current Every Day Smoker    Packs/day: 0.25    Years: 18.00    Pack years: 4.50    Types: Cigarettes  . Smokeless tobacco: Never Used  Substance Use Topics  . Alcohol use: Yes    Comment: 1-2 BEERS weekly    FAMILY HISTORY: No family history on file.  DRUG ALLERGIES:  Allergies  Allergen Reactions  . Mushroom Extract Complex Anaphylaxis  . Lactose Intolerance (Gi) Nausea And Vomiting  . Normal Saline [Sodium  Chloride] Other (See Comments)    Testicle swelling  . Penicillins Nausea And Vomiting    Has patient had a PCN reaction causing immediate rash, facial/tongue/throat swelling, SOB or lightheadedness with hypotension: No Has patient had a PCN reaction causing severe rash involving mucus membranes or skin necrosis: No Has patient had a PCN reaction that required hospitalization No Has patient had a PCN reaction occurring within the last 10 years: Yes If all of the above answers are "NO", then may proceed with Cephalosporin use.   . Prednisone Itching and Other (See Comments)    Swelling of testical    REVIEW OF SYSTEMS:   CONSTITUTIONAL: No fever, fatigue or weakness.  EYES: No blurred or double vision.  EARS, NOSE, AND THROAT: No tinnitus or ear pain.  RESPIRATORY: No cough, shortness of breath, wheezing or hemoptysis.  CARDIOVASCULAR: No chest pain, orthopnea, edema.  GASTROINTESTINAL: Positive nausea, positive vomiting, diarrhea or positive abdominal pain .  GENITOURINARY: No dysuria, hematuria.  ENDOCRINE: No polyuria, nocturia,  HEMATOLOGY: No anemia, easy bruising or bleeding SKIN: No rash or lesion. MUSCULOSKELETAL: No joint pain or arthritis.   NEUROLOGIC: No tingling, numbness, weakness.  PSYCHIATRY: No anxiety or depression.   MEDICATIONS AT HOME:  Prior to Admission medications   Medication Sig Start Date End Date Taking? Authorizing Provider  dicyclomine (BENTYL) 20 MG tablet Take 1  tablet (20 mg total) by mouth 3 (three) times daily as needed for spasms. 05/14/18 05/14/19  Orbie Pyo, MD  ibuprofen (ADVIL,MOTRIN) 600 MG tablet Take 1 tablet (600 mg total) by mouth every 8 (eight) hours as needed. Patient not taking: Reported on 05/14/2018 02/01/18   Sable Feil, PA-C  meloxicam (MOBIC) 15 MG tablet Take 15 mg by mouth daily.     [provider]  mupirocin ointment (BACTROBAN) 2 % Apply 1 application topically 2 (two) times daily. Patient not  taking: Reported on 05/14/2018 12/23/17   Versie Starks, PA-C  ondansetron (ZOFRAN) 4 MG tablet Take 1 tablet (4 mg total) by mouth daily as needed. Patient not taking: Reported on 09/24/2018 05/14/18   Schaevitz, Randall An, MD  traMADol (ULTRAM) 50 MG tablet Take 1 tablet (50 mg total) by mouth every 12 (twelve) hours as needed. Patient not taking: Reported on 05/14/2018 02/01/18   Sable Feil, PA-C      PHYSICAL EXAMINATION:   VITAL SIGNS: Blood pressure 132/76, pulse 71, temperature 98.3 F (36.8 C), temperature source Oral, resp. rate 20, height 5\' 10"  (1.778 m), weight 106.6 kg, SpO2 100 %.  GENERAL:  33 y.o.-year-old patient lying in the bed with no acute distress.  EYES: Pupils equal, round, reactive to light and accommodation. No scleral icterus. Extraocular muscles intact.  HEENT: Head atraumatic, normocephalic. Oropharynx and nasopharynx clear.  NECK:  Supple, no jugular venous distention. No thyroid enlargement, no tenderness.  LUNGS: Normal breath sounds bilaterally, no wheezing, rales,rhonchi or crepitation. No use of accessory muscles of respiration.  CARDIOVASCULAR: S1, S2 normal. No murmurs, rubs, or gallops.  ABDOMEN: Soft, epigastric, nondistended. Bowel sounds present. No organomegaly or mass.  EXTREMITIES: No pedal edema, cyanosis, or clubbing.  NEUROLOGIC: Cranial nerves II through XII are intact. Muscle strength 5/5 in all extremities. Sensation intact. Gait not checked.  PSYCHIATRIC: The patient is alert and oriented x 3.  SKIN: No obvious rash, lesion, or ulcer.   LABORATORY PANEL:   CBC Recent Labs  Lab 09/24/18 0749  WBC 7.0  HGB 14.7  HCT 44.0  PLT 235  MCV 86.8  MCH 29.0  MCHC 33.4  RDW 13.5  LYMPHSABS 2.1  MONOABS 0.4  EOSABS 0.1  BASOSABS 0.0   ------------------------------------------------------------------------------------------------------------------  Chemistries  Recent Labs  Lab 09/24/18 0749  NA 139  K 4.0  CL 104  CO2  24  GLUCOSE 106*  BUN 14  CREATININE 0.94  CALCIUM 8.8*  AST 23  ALT 27  ALKPHOS 47  BILITOT 0.6   ------------------------------------------------------------------------------------------------------------------ estimated creatinine clearance is 137.9 mL/min (by C-G formula based on SCr of 0.94 mg/dL). ------------------------------------------------------------------------------------------------------------------ No results for input(s): TSH, T4TOTAL, T3FREE, THYROIDAB in the last 72 hours.  Invalid input(s): FREET3   Coagulation profile No results for input(s): INR, PROTIME in the last 168 hours. ------------------------------------------------------------------------------------------------------------------- No results for input(s): DDIMER in the last 72 hours. -------------------------------------------------------------------------------------------------------------------  Cardiac Enzymes No results for input(s): CKMB, TROPONINI, MYOGLOBIN in the last 168 hours.  Invalid input(s): CK ------------------------------------------------------------------------------------------------------------------ Invalid input(s): POCBNP  ---------------------------------------------------------------------------------------------------------------  Urinalysis    Component Value Date/Time   COLORURINE YELLOW (A) 09/24/2018 0823   APPEARANCEUR CLEAR (A) 09/24/2018 0823   APPEARANCEUR Clear 09/07/2014 1544   LABSPEC 1.026 09/24/2018 0823   LABSPEC 1.016 09/07/2014 1544   PHURINE 6.0 09/24/2018 0823   GLUCOSEU NEGATIVE 09/24/2018 0823   GLUCOSEU Negative 09/07/2014 1544   HGBUR NEGATIVE 09/24/2018 0823   BILIRUBINUR NEGATIVE 09/24/2018 0823   BILIRUBINUR Negative  09/07/2014 Victoria Vera 09/24/2018 0823   PROTEINUR NEGATIVE 09/24/2018 0823   NITRITE NEGATIVE 09/24/2018 0823   LEUKOCYTESUR NEGATIVE 09/24/2018 0823   LEUKOCYTESUR Negative 09/07/2014 1544      RADIOLOGY: Ct Abdomen Pelvis W Contrast  Result Date: 09/24/2018 CLINICAL DATA:  Acute generalized abdominal pain. EXAM: CT ABDOMEN AND PELVIS WITH CONTRAST TECHNIQUE: Multidetector CT imaging of the abdomen and pelvis was performed using the standard protocol following bolus administration of intravenous contrast. CONTRAST:  186mL OMNIPAQUE IOHEXOL 300 MG/ML  SOLN COMPARISON:  05/14/2018; 09/14/2016 FINDINGS: Lower chest: Limited visualization of the lower thorax demonstrates minimal dependent subsegmental atelectasis, most conspicuous within the right lower lobe. No focal airspace opacities. No pleural effusion. Normal heart size.  No pericardial effusion. Hepatobiliary: Normal hepatic contour. There is a minimal amount focal fatty infiltration adjacent to the fissure for the ligamentum teres. No discrete hepatic lesions. Normal appearance of the gallbladder given degree distention. No radiopaque gallstones. No intra or extrahepatic biliary dilatation. No ascites. Pancreas: Normal appearance of the pancreas. Spleen: Normal appearance of the spleen. Adrenals/Urinary Tract: There is symmetric enhancement of the bilateral kidneys. Note is made of 2 adjacent punctate (approximately 4 mm) nonobstructing stones within the interpolar aspect the right kidney (images 31 and 32, series 2). Note is made of several bilateral punctate hypoattenuating renal lesions, right greater than left, too small to accurately characterize of favored to represent renal cysts. No urine obstruction or perinephric stranding. Normal appearance the bilateral adrenal glands. Normal appearance of the urinary bladder given degree distention. Stomach/Bowel: Mild fluid distension of the tip of the cecum without associated adjacent inflammatory change. Normal appearance of the retrocecal appendix (best seen on coronal images 37 through 44, series 5). Potential mild circumferential wall thickening involving several loops of terminal ileum  (representative coronal images 35 and 38, series 5), similar to abdominal CT performed 05/14/2018 and again without associated upstream dilatation to suggest enteric obstruction. The bowel otherwise in appears normal in course and caliber. No pneumoperitoneum, pneumatosis or portal venous gas. Small hiatal hernia. Vascular/Lymphatic: Normal caliber the abdominal aorta. The major branch vessels of the abdominal aorta appear patent on this non CTA examination. No bulky retroperitoneal, mesenteric, pelvic or inguinal lymphadenopathy. Reproductive: Normal appearance the prostate gland. There is a trace amount of presumably reactive free fluid is seen with the pelvic cul-de-sac. Other: Regional soft tissues appear normal. Musculoskeletal: No acute or aggressive osseous abnormalities. Normal appearance of the bilateral SI joints without CT evidence of sacroiliitis. Note is made of a small right-sided os acetabuli. IMPRESSION: 1. Mild circumferential wall thickening involving several loops of distal small bowel, similar to the 04/2018 examination and without evidence of enteric obstruction. Findings are nonspecific though could be seen in the setting of an inflammatory enteritis (such as Crohn's colitis). 2. Otherwise, no explanation for patient's diffuse abdominal pain. Specifically, normal appearance of the appendix. No evidence of enteric or urinary obstruction. 3. Nonobstructing right-sided nephrolithiasis. Electronically Signed   By: Sandi Mariscal M.D.   On: 09/24/2018 10:37   Dg Abd 2 Views  Result Date: 09/24/2018 CLINICAL DATA:  Abdominal pain EXAM: ABDOMEN - 2 VIEW COMPARISON:  05/14/2018 abdominal CT FINDINGS: There are a few air-fluid levels in the right abdomen and mildly distended small bowel loops in the left abdomen. 2 right renal calculi measuring up to 5 mm. Negative for pneumoperitoneum. Lung bases are clear. IMPRESSION: 1. Mildly distended small bowel loops with scattered fluid levels. Enteritis or  early partial bowel obstruction  could have this appearance. 2. Right nephrolithiasis. Electronically Signed   By: Monte Fantasia M.D.   On: 09/24/2018 08:09    EKG: Orders placed or performed during the hospital encounter of 05/14/18  . ED EKG  . ED EKG  . EKG 12-Lead  . EKG 12-Lead    IMPRESSION AND PLAN: Patient's 33 year old presenting with abdominal pain  1.  Abdominal pain possibly due to focal colitis I will treat with IV Flagyl and Cipro There is concern for possible inflammatory bowel disease will obtain a GI consult I will also place on Protonix twice daily  2.  Nicotine abuse smoking cessation provided 4 minutes spent strongly recommend he stop smoking nicotine patch offered he did not want that    All the records are reviewed and case discussed with ED provider. Management plans discussed with the patient, family and they are in agreement.  CODE STATUS:    TOTAL TIME TAKING CARE OF THIS PATIENT: 55 minutes.    Dustin Flock M.D on 09/24/2018 at 11:01 AM  Between 7am to 6pm - Pager - [phone]  After 6pm go to [email or web address] - password [email or web address] [facility]  [facility]  [phone]  CC: Primary care physician; Patient, No Pcp Per

## 2018-09-25 LAB — CBC
HCT: 40.8 % (ref 39.0–52.0)
Hemoglobin: 13.5 g/dL (ref 13.0–17.0)
MCH: 29.3 pg (ref 26.0–34.0)
MCHC: 33.1 g/dL (ref 30.0–36.0)
MCV: 88.7 fL (ref 80.0–100.0)
Platelets: 230 10*3/uL (ref 150–400)
RBC: 4.6 MIL/uL (ref 4.22–5.81)
RDW: 13.5 % (ref 11.5–15.5)
WBC: 6.2 10*3/uL (ref 4.0–10.5)
nRBC: 0 % (ref 0.0–0.2)

## 2018-09-25 LAB — BASIC METABOLIC PANEL
Anion gap: 7 (ref 5–15)
BUN: 7 mg/dL (ref 6–20)
CO2: 24 mmol/L (ref 22–32)
Calcium: 8.2 mg/dL — ABNORMAL LOW (ref 8.9–10.3)
Chloride: 105 mmol/L (ref 98–111)
Creatinine, Ser: 0.99 mg/dL (ref 0.61–1.24)
GFR calc Af Amer: >60 mL/min (ref >60)
GFR calc non Af Amer: >60 mL/min (ref >60)
Glucose, Bld: 92 mg/dL (ref 70–99)
Potassium: 3.5 mmol/L (ref 3.5–5.1)
Sodium: 136 mmol/L (ref 135–145)

## 2018-09-25 LAB — HIV ANTIBODY (ROUTINE TESTING W REFLEX): HIV Screen 4th Generation wRfx: NONREACTIVE

## 2018-09-25 MED ORDER — SODIUM CHLORIDE 0.9 % IV SOLN
INTRAVENOUS | Status: DC | PRN
  Administered 2018-09-25 – 2018-09-26 (×3): 500 mL via INTRAVENOUS

## 2018-09-25 NOTE — Progress Notes (Signed)
[place] , MD [address] [place], [place], [place], [address] [address], [place], [place], [place], [address] Phone: [phone]  Fax: [phone]   [name] is being followed for enteritis  Day 1 of follow up   Subjective: Feeling better - no vomiting,diarrhea and less abdominal pain   Objective: Vital signs in last 24 hours: Vitals:   09/24/18 1130 09/24/18 1205 09/24/18 1934 09/25/18 0321  BP: (!) 149/86 (!) 143/75 (!) 149/91 139/81  Pulse:  (!) 59 60 65  Resp: 13 18 18 18   Temp:  98 F (36.7 C) 98.1 F (36.7 C) 98 F (36.7 C)  TempSrc:  Oral Oral Oral  SpO2:   100% 100%  Weight:      Height:       Weight change:   Intake/Output Summary (Last 24 hours) at 09/25/2018 1003 Last data filed at 09/25/2018 6144 Gross per 24 hour  Intake 1862 ml  Output -  Net 1862 ml     Exam: Heart:: Regular rate and rhythm, S1S2 present or without murmur or extra heart sounds Lungs: normal, clear to auscultation and clear to auscultation and percussion Abdomen: soft, nontender, normal bowel sounds   Lab Results: @LABTEST2 @ Micro Results: No results found for this or any previous visit (from the past 240 hour(s)). Studies/Results: Ct Abdomen Pelvis W Contrast  Result Date: 09/24/2018 CLINICAL DATA:  Acute generalized abdominal pain. EXAM: CT ABDOMEN AND PELVIS WITH CONTRAST TECHNIQUE: Multidetector CT imaging of the abdomen and pelvis was performed using the standard protocol following bolus administration of intravenous contrast. CONTRAST:  161mL OMNIPAQUE IOHEXOL 300 MG/ML  SOLN COMPARISON:  05/14/2018; 09/14/2016 FINDINGS: Lower chest: Limited visualization of the lower thorax demonstrates minimal dependent subsegmental atelectasis, most conspicuous within the right lower lobe. No focal airspace opacities. No pleural effusion. Normal heart size.  No pericardial effusion. Hepatobiliary: Normal hepatic contour. There is a minimal amount focal fatty infiltration  adjacent to the fissure for the ligamentum teres. No discrete hepatic lesions. Normal appearance of the gallbladder given degree distention. No radiopaque gallstones. No intra or extrahepatic biliary dilatation. No ascites. Pancreas: Normal appearance of the pancreas. Spleen: Normal appearance of the spleen. Adrenals/Urinary Tract: There is symmetric enhancement of the bilateral kidneys. Note is made of 2 adjacent punctate (approximately 4 mm) nonobstructing stones within the interpolar aspect the right kidney (images 31 and 32, series 2). Note is made of several bilateral punctate hypoattenuating renal lesions, right greater than left, too small to accurately characterize of favored to represent renal cysts. No urine obstruction or perinephric stranding. Normal appearance the bilateral adrenal glands. Normal appearance of the urinary bladder given degree distention. Stomach/Bowel: Mild fluid distension of the tip of the cecum without associated adjacent inflammatory change. Normal appearance of the retrocecal appendix (best seen on coronal images 37 through 44, series 5). Potential mild circumferential wall thickening involving several loops of terminal ileum (representative coronal images 35 and 38, series 5), similar to abdominal CT performed 05/14/2018 and again without associated upstream dilatation to suggest enteric obstruction. The bowel otherwise in appears normal in course and caliber. No pneumoperitoneum, pneumatosis or portal venous gas. Small hiatal hernia. Vascular/Lymphatic: Normal caliber the abdominal aorta. The major branch vessels of the abdominal aorta appear patent on this non CTA examination. No bulky retroperitoneal, mesenteric, pelvic or inguinal lymphadenopathy. Reproductive: Normal appearance the prostate gland. There is a trace amount of presumably reactive free fluid is seen with the pelvic cul-de-sac. Other: Regional soft tissues appear  normal. Musculoskeletal: No acute or aggressive  osseous abnormalities. Normal appearance of the bilateral SI joints without CT evidence of sacroiliitis. Note is made of a small right-sided os acetabuli. IMPRESSION: 1. Mild circumferential wall thickening involving several loops of distal small bowel, similar to the 04/2018 examination and without evidence of enteric obstruction. Findings are nonspecific though could be seen in the setting of an inflammatory enteritis (such as Crohn's colitis). 2. Otherwise, no explanation for patient's diffuse abdominal pain. Specifically, normal appearance of the appendix. No evidence of enteric or urinary obstruction. 3. Nonobstructing right-sided nephrolithiasis. Electronically Signed   By: Sandi Mariscal M.D.   On: 09/24/2018 10:37   Dg Abd 2 Views  Result Date: 09/24/2018 CLINICAL DATA:  Abdominal pain EXAM: ABDOMEN - 2 VIEW COMPARISON:  05/14/2018 abdominal CT FINDINGS: There are a few air-fluid levels in the right abdomen and mildly distended small bowel loops in the left abdomen. 2 right renal calculi measuring up to 5 mm. Negative for pneumoperitoneum. Lung bases are clear. IMPRESSION: 1. Mildly distended small bowel loops with scattered fluid levels. Enteritis or early partial bowel obstruction could have this appearance. 2. Right nephrolithiasis. Electronically Signed   By: Monte Fantasia M.D.   On: 09/24/2018 08:09   Medications: I have reviewed the patient's current medications. Scheduled Meds: . enoxaparin (LOVENOX) injection  40 mg Subcutaneous Q24H  . nicotine  21 mg Transdermal Daily  . pantoprazole (PROTONIX) IV  40 mg Intravenous Q12H   Continuous Infusions: . sodium chloride 100 mL/hr at 09/25/18 0047  . ciprofloxacin 400 mg (09/25/18 0051)  . metronidazole 500 mg (09/25/18 0508)   PRN Meds:.acetaminophen **OR** acetaminophen, dicyclomine, morphine injection, ondansetron **OR** ondansetron (ZOFRAN) IV, oxyCODONE-acetaminophen   Assessment: Active Problems:   Abdominal pain  W[place] is a 33 y.o. y/o male admitted today with the second presentation of abdominal pain , imaging suggestive of enteritis. Differentials include infection but Crohn's disease also needs to be ruled out.  CRP normal. Due to cocaine in urine cannot perform endoscopy   Plan  1. Continue Ciprofloxacin and flagyl 2. I would have like to perform a colonoscopy but he has cocaine in the urine which will not permit anesthesia. Hence likely will need to be performed as an outpatient  3. No NSAID's/smoking  4. F/u stool for GI PCR 5. Commence on clears when he is able to tolerate and advance.     LOS: 1 day   Wesley Bellows, MD 09/25/2018, 10:03 AM

## 2018-09-25 NOTE — Progress Notes (Signed)
[place] at [facility]                                                                                                                                                                                  Patient Demographics   Wesley Hart, is a 33 y.o. male, DOB - [birth year], GYK:[id number]  Admit date - 09/24/2018   Admitting Physician Dustin Flock, MD  Outpatient Primary MD for the patient is Patient, No Pcp Per   LOS - 1  Subjective: Patient continues to complain of abdominal pain states that he is unable to eat    Review of Systems:   CONSTITUTIONAL: No documented fever. No fatigue, weakness. No weight gain, no weight loss.  EYES: No blurry or double vision.  ENT: No tinnitus. No postnasal drip. No redness of the oropharynx.  RESPIRATORY: No cough, no wheeze, no hemoptysis. No dyspnea.  CARDIOVASCULAR: No chest pain. No orthopnea. No palpitations. No syncope.  GASTROINTESTINAL: No nausea, no vomiting or diarrhea.  Positive abdominal pain. No melena or hematochezia.  GENITOURINARY: No dysuria or hematuria.  ENDOCRINE: No polyuria or nocturia. No heat or cold intolerance.  HEMATOLOGY: No anemia. No bruising. No bleeding.  INTEGUMENTARY: No rashes. No lesions.  MUSCULOSKELETAL: No arthritis. No swelling. No gout.  NEUROLOGIC: No numbness, tingling, or ataxia. No seizure-type activity.  PSYCHIATRIC: No anxiety. No insomnia. No ADD.    Vitals:   Vitals:   09/24/18 1205 09/24/18 1934 09/25/18 0321 09/25/18 1007  BP: (!) 143/75 (!) 149/91 139/81 129/86  Pulse: (!) 59 60 65 61  Resp: 18 18 18 17   Temp: 98 F (36.7 C) 98.1 F (36.7 C) 98 F (36.7 C) 98.2 F (36.8 C)  TempSrc: Oral Oral Oral Oral  SpO2:  100% 100% 100%  Weight:      Height:        Wt Readings from Last 3 Encounters:  09/24/18 106.6 kg  05/15/18 108.9 kg  05/14/18 108 kg     Intake/Output Summary (Last 24 hours) at 09/25/2018 1146 Last data filed at 09/25/2018 7939 Gross  per 24 hour  Intake 1862 ml  Output -  Net 1862 ml    Physical Exam:   GENERAL: Pleasant-appearing in no apparent distress.  HEAD, EYES, EARS, NOSE AND THROAT: Atraumatic, normocephalic. Extraocular muscles are intact. Pupils equal and reactive to light. Sclerae anicteric. No conjunctival injection. No oro-pharyngeal erythema.  NECK: Supple. There is no jugular venous distention. No bruits, no lymphadenopathy, no thyromegaly.  HEART: Regular rate and rhythm,. No murmurs, no rubs, no clicks.  LUNGS: Clear to auscultation bilaterally. No rales or rhonchi. No wheezes.  ABDOMEN: Soft, flat, epigastric tenderness nondistended.  Has good bowel sounds. No hepatosplenomegaly appreciated.  EXTREMITIES: No evidence of any cyanosis, clubbing, or peripheral edema.  +2 pedal and radial pulses bilaterally.  NEUROLOGIC: The patient is alert, awake, and oriented x3 with no focal motor or sensory deficits appreciated bilaterally.  SKIN: Moist and warm with no rashes appreciated.  Psych: Not anxious, depressed LN: No inguinal LN enlargement    Antibiotics   Anti-infectives (From admission, onward)   Start     Dose/Rate Route Frequency Ordered Stop   09/24/18 1200  ciprofloxacin (CIPRO) IVPB 400 mg     400 mg 200 mL/hr over 60 Minutes Intravenous Every 12 hours 09/24/18 1115     09/24/18 1130  metroNIDAZOLE (FLAGYL) IVPB 500 mg     500 mg 100 mL/hr over 60 Minutes Intravenous Every 8 hours 09/24/18 1115        Medications   Scheduled Meds: . enoxaparin (LOVENOX) injection  40 mg Subcutaneous Q24H  . nicotine  21 mg Transdermal Daily  . pantoprazole (PROTONIX) IV  40 mg Intravenous Q12H   Continuous Infusions: . ciprofloxacin 400 mg (09/25/18 0051)  . metronidazole 500 mg (09/25/18 0508)   PRN Meds:.acetaminophen **OR** acetaminophen, dicyclomine, morphine injection, ondansetron **OR** ondansetron (ZOFRAN) IV, oxyCODONE-acetaminophen   Data Review:   Micro Results No results found for  this or any previous visit (from the past 240 hour(s)).  Radiology Reports Ct Abdomen Pelvis W Contrast  Result Date: 09/24/2018 CLINICAL DATA:  Acute generalized abdominal pain. EXAM: CT ABDOMEN AND PELVIS WITH CONTRAST TECHNIQUE: Multidetector CT imaging of the abdomen and pelvis was performed using the standard protocol following bolus administration of intravenous contrast. CONTRAST:  121mL OMNIPAQUE IOHEXOL 300 MG/ML  SOLN COMPARISON:  05/14/2018; 09/14/2016 FINDINGS: Lower chest: Limited visualization of the lower thorax demonstrates minimal dependent subsegmental atelectasis, most conspicuous within the right lower lobe. No focal airspace opacities. No pleural effusion. Normal heart size.  No pericardial effusion. Hepatobiliary: Normal hepatic contour. There is a minimal amount focal fatty infiltration adjacent to the fissure for the ligamentum teres. No discrete hepatic lesions. Normal appearance of the gallbladder given degree distention. No radiopaque gallstones. No intra or extrahepatic biliary dilatation. No ascites. Pancreas: Normal appearance of the pancreas. Spleen: Normal appearance of the spleen. Adrenals/Urinary Tract: There is symmetric enhancement of the bilateral kidneys. Note is made of 2 adjacent punctate (approximately 4 mm) nonobstructing stones within the interpolar aspect the right kidney (images 31 and 32, series 2). Note is made of several bilateral punctate hypoattenuating renal lesions, right greater than left, too small to accurately characterize of favored to represent renal cysts. No urine obstruction or perinephric stranding. Normal appearance the bilateral adrenal glands. Normal appearance of the urinary bladder given degree distention. Stomach/Bowel: Mild fluid distension of the tip of the cecum without associated adjacent inflammatory change. Normal appearance of the retrocecal appendix (best seen on coronal images 37 through 44, series 5). Potential mild circumferential  wall thickening involving several loops of terminal ileum (representative coronal images 35 and 38, series 5), similar to abdominal CT performed 05/14/2018 and again without associated upstream dilatation to suggest enteric obstruction. The bowel otherwise in appears normal in course and caliber. No pneumoperitoneum, pneumatosis or portal venous gas. Small hiatal hernia. Vascular/Lymphatic: Normal caliber the abdominal aorta. The major branch vessels of the abdominal aorta appear patent on this non CTA examination. No bulky retroperitoneal, mesenteric, pelvic or inguinal lymphadenopathy. Reproductive: Normal appearance the prostate gland. There is a trace amount of presumably reactive free fluid is seen  with the pelvic cul-de-sac. Other: Regional soft tissues appear normal. Musculoskeletal: No acute or aggressive osseous abnormalities. Normal appearance of the bilateral SI joints without CT evidence of sacroiliitis. Note is made of a small right-sided os acetabuli. IMPRESSION: 1. Mild circumferential wall thickening involving several loops of distal small bowel, similar to the 04/2018 examination and without evidence of enteric obstruction. Findings are nonspecific though could be seen in the setting of an inflammatory enteritis (such as Crohn's colitis). 2. Otherwise, no explanation for patient's diffuse abdominal pain. Specifically, normal appearance of the appendix. No evidence of enteric or urinary obstruction. 3. Nonobstructing right-sided nephrolithiasis. Electronically Signed   By: Sandi Mariscal M.D.   On: 09/24/2018 10:37   Dg Abd 2 Views  Result Date: 09/24/2018 CLINICAL DATA:  Abdominal pain EXAM: ABDOMEN - 2 VIEW COMPARISON:  05/14/2018 abdominal CT FINDINGS: There are a few air-fluid levels in the right abdomen and mildly distended small bowel loops in the left abdomen. 2 right renal calculi measuring up to 5 mm. Negative for pneumoperitoneum. Lung bases are clear. IMPRESSION: 1. Mildly distended small  bowel loops with scattered fluid levels. Enteritis or early partial bowel obstruction could have this appearance. 2. Right nephrolithiasis. Electronically Signed   By: Monte Fantasia M.D.   On: 09/24/2018 08:09     CBC Recent Labs  Lab 09/24/18 0749 09/25/18 0259  WBC 7.0 6.2  HGB 14.7 13.5  HCT 44.0 40.8  PLT 235 230  MCV 86.8 88.7  MCH 29.0 29.3  MCHC 33.4 33.1  RDW 13.5 13.5  LYMPHSABS 2.1  --   MONOABS 0.4  --   EOSABS 0.1  --   BASOSABS 0.0  --     Chemistries  Recent Labs  Lab 09/24/18 0749 09/25/18 0259  NA 139 136  K 4.0 3.5  CL 104 105  CO2 24 24  GLUCOSE 106* 92  BUN 14 7  CREATININE 0.94 0.99  CALCIUM 8.8* 8.2*  AST 23  --   ALT 27  --   ALKPHOS 47  --   BILITOT 0.6  --    ------------------------------------------------------------------------------------------------------------------ estimated creatinine clearance is 130.9 mL/min (by C-G formula based on SCr of 0.99 mg/dL). ------------------------------------------------------------------------------------------------------------------ No results for input(s): HGBA1C in the last 72 hours. ------------------------------------------------------------------------------------------------------------------ No results for input(s): CHOL, HDL, LDLCALC, TRIG, CHOLHDL, LDLDIRECT in the last 72 hours. ------------------------------------------------------------------------------------------------------------------ No results for input(s): TSH, T4TOTAL, T3FREE, THYROIDAB in the last 72 hours.  Invalid input(s): FREET3 ------------------------------------------------------------------------------------------------------------------ No results for input(s): VITAMINB12, FOLATE, FERRITIN, TIBC, IRON, RETICCTPCT in the last 72 hours.  Coagulation profile No results for input(s): INR, PROTIME in the last 168 hours.  No results for input(s): DDIMER in the last 72 hours.  Cardiac Enzymes No results for input(s):  CKMB, TROPONINI, MYOGLOBIN in the last 168 hours.  Invalid input(s): CK ------------------------------------------------------------------------------------------------------------------ Invalid input(s): POCBNP    Assessment & Plan  IMPRESSION AND PLAN: Patient's 33 year old presenting with abdominal pain  1.  Abdominal pain possibly due to focal colitis Continue IV Flagyl and Cipro There is concern for possible inflammatory bowel disease  GI seen the patient no plan for colonoscopy due to cocaine positive   2.  Nicotine abuse smoking cessation provided yesterday      Code Status Orders  (From admission, onward)         Start     Ordered   09/24/18 1206  Full code  Continuous     09/24/18 1205        Code Status History  This patient has a current code status but no historical code status.           Consults  gi  DVT Prophylaxis  Lovenox    Lab Results  Component Value Date   PLT 230 09/25/2018     Time Spent in minutes 83min Greater than 50% of time spent in care coordination and counseling patient regarding the condition and plan of care.   Dustin Flock M.D on 09/25/2018 at 11:46 AM  Between 7am to 6pm - Pager - [phone]  After 6pm go to [email or web address] - Proofreader  [employer]   Office  [phone]

## 2018-09-26 LAB — GASTROINTESTINAL PANEL BY PCR, STOOL (REPLACES STOOL CULTURE)

## 2018-09-26 MED ORDER — PANTOPRAZOLE SODIUM 40 MG PO TBEC: 40.0000 mg | DELAYED_RELEASE_TABLET | Freq: Every day | ORAL | 1 refills | Status: AC

## 2018-09-26 MED ORDER — DICYCLOMINE HCL 20 MG PO TABS: 20.0000 mg | ORAL_TABLET | Freq: Three times a day (TID) | ORAL | 0 refills | Status: AC | PRN

## 2018-09-26 MED ORDER — OXYCODONE-ACETAMINOPHEN 5-325 MG PO TABS: 1.0000 | ORAL_TABLET | Freq: Four times a day (QID) | ORAL | 0 refills | Status: DC | PRN

## 2018-09-26 MED ORDER — CIPROFLOXACIN HCL 500 MG PO TABS: 500.0000 mg | ORAL_TABLET | Freq: Two times a day (BID) | ORAL | 0 refills | Status: AC

## 2018-09-26 NOTE — Discharge Summary (Signed)
[place] at [facility], [place] y.o., DOB [birth year], MRN [id number]. Admission date: 09/24/2018 Discharge Date 09/26/2018 Primary MD Patient, No Pcp Per Admitting Physician Dustin Flock, MD  Admission Diagnosis  Generalized abdominal pain [R10.84] Non-intractable vomiting with nausea, unspecified vomiting type [R11.2]  Discharge Diagnosis   Active Problems:   Abdominal pain due to possible acute colitis Nicotine abuse        Hospital Course  Wesley Hart  is a 33 y.o. male with a known history of lactose intolerance and stomach ulcer who presents with abdominal pain since 6 AM this morning.  He describes it as sharp epigastric pain.  Patient states that for months he has been having this pain on and off.  He was evaluated in the ER CT scan showed possible focal colitis.    Patient was admitted and started on antibiotics.  He was also seen by GI plan was to do a colonoscopy and EGD however patient's urine drug screen was positive for cocaine therefore he will follow-up outpatient with GI for further evaluation.           Consults  GI  Significant Tests:  See full reports for all details    Ct Abdomen Pelvis W Contrast  Result Date: 09/24/2018 CLINICAL DATA:  Acute generalized abdominal pain. EXAM: CT ABDOMEN AND PELVIS WITH CONTRAST TECHNIQUE: Multidetector CT imaging of the abdomen and pelvis was performed using the standard protocol following bolus administration of intravenous contrast. CONTRAST:  157mL OMNIPAQUE IOHEXOL 300 MG/ML  SOLN COMPARISON:  05/14/2018; 09/14/2016 FINDINGS: Lower chest: Limited visualization of the lower thorax demonstrates minimal dependent subsegmental atelectasis, most conspicuous within the right lower lobe. No focal airspace opacities. No pleural effusion. Normal heart size.  No pericardial effusion. Hepatobiliary: Normal hepatic contour. There is a minimal amount focal fatty infiltration adjacent to the  fissure for the ligamentum teres. No discrete hepatic lesions. Normal appearance of the gallbladder given degree distention. No radiopaque gallstones. No intra or extrahepatic biliary dilatation. No ascites. Pancreas: Normal appearance of the pancreas. Spleen: Normal appearance of the spleen. Adrenals/Urinary Tract: There is symmetric enhancement of the bilateral kidneys. Note is made of 2 adjacent punctate (approximately 4 mm) nonobstructing stones within the interpolar aspect the right kidney (images 31 and 32, series 2). Note is made of several bilateral punctate hypoattenuating renal lesions, right greater than left, too small to accurately characterize of favored to represent renal cysts. No urine obstruction or perinephric stranding. Normal appearance the bilateral adrenal glands. Normal appearance of the urinary bladder given degree distention. Stomach/Bowel: Mild fluid distension of the tip of the cecum without associated adjacent inflammatory change. Normal appearance of the retrocecal appendix (best seen on coronal images 37 through 44, series 5). Potential mild circumferential wall thickening involving several loops of terminal ileum (representative coronal images 35 and 38, series 5), similar to abdominal CT performed 05/14/2018 and again without associated upstream dilatation to suggest enteric obstruction. The bowel otherwise in appears normal in course and caliber. No pneumoperitoneum, pneumatosis or portal venous gas. Small hiatal hernia. Vascular/Lymphatic: Normal caliber the abdominal aorta. The major branch vessels of the abdominal aorta appear patent on this non CTA examination. No bulky retroperitoneal, mesenteric, pelvic or inguinal lymphadenopathy. Reproductive: Normal appearance the prostate gland. There is a trace amount of presumably reactive free fluid is seen with the pelvic cul-de-sac. Other: Regional soft tissues appear normal. Musculoskeletal: No acute or aggressive osseous  abnormalities. Normal appearance of the bilateral SI joints  without CT evidence of sacroiliitis. Note is made of a small right-sided os acetabuli. IMPRESSION: 1. Mild circumferential wall thickening involving several loops of distal small bowel, similar to the 04/2018 examination and without evidence of enteric obstruction. Findings are nonspecific though could be seen in the setting of an inflammatory enteritis (such as Crohn's colitis). 2. Otherwise, no explanation for patient's diffuse abdominal pain. Specifically, normal appearance of the appendix. No evidence of enteric or urinary obstruction. 3. Nonobstructing right-sided nephrolithiasis. Electronically Signed   By: Sandi Mariscal M.D.   On: 09/24/2018 10:37   Dg Abd 2 Views  Result Date: 09/24/2018 CLINICAL DATA:  Abdominal pain EXAM: ABDOMEN - 2 VIEW COMPARISON:  05/14/2018 abdominal CT FINDINGS: There are a few air-fluid levels in the right abdomen and mildly distended small bowel loops in the left abdomen. 2 right renal calculi measuring up to 5 mm. Negative for pneumoperitoneum. Lung bases are clear. IMPRESSION: 1. Mildly distended small bowel loops with scattered fluid levels. Enteritis or early partial bowel obstruction could have this appearance. 2. Right nephrolithiasis. Electronically Signed   By: Monte Fantasia M.D.   On: 09/24/2018 08:09       Today   Subjective:   [name] since denies abdominal pain feeling better Objective:   Blood pressure 118/65, pulse (!) 47, temperature 97.6 F (36.4 C), temperature source Oral, resp. rate 19, height 5\' 10"  (1.778 m), weight 106.6 kg, SpO2 98 %.  .  Intake/Output Summary (Last 24 hours) at 09/26/2018 1236 Last data filed at 09/26/2018 0417 Gross per 24 hour  Intake 1386.2 ml  Output -  Net 1386.2 ml    Exam VITAL SIGNS: Blood pressure 118/65, pulse (!) 47, temperature 97.6 F (36.4 C), temperature source Oral, resp. rate 19, height 5\' 10"  (1.778 m), weight 106.6 kg, SpO2 98  %.  GENERAL:  33 y.o.-year-old patient lying in the bed with no acute distress.  EYES: Pupils equal, round, reactive to light and accommodation. No scleral icterus. Extraocular muscles intact.  HEENT: Head atraumatic, normocephalic. Oropharynx and nasopharynx clear.  NECK:  Supple, no jugular venous distention. No thyroid enlargement, no tenderness.  LUNGS: Normal breath sounds bilaterally, no wheezing, rales,rhonchi or crepitation. No use of accessory muscles of respiration.  CARDIOVASCULAR: S1, S2 normal. No murmurs, rubs, or gallops.  ABDOMEN: Soft, nontender, nondistended. Bowel sounds present. No organomegaly or mass.  EXTREMITIES: No pedal edema, cyanosis, or clubbing.  NEUROLOGIC: Cranial nerves II through XII are intact. Muscle strength 5/5 in all extremities. Sensation intact. Gait not checked.  PSYCHIATRIC: The patient is alert and oriented x 3.  SKIN: No obvious rash, lesion, or ulcer.   Data Review     CBC w Diff:  Lab Results  Component Value Date   WBC 6.2 09/25/2018   HGB 13.5 09/25/2018   HGB 13.8 09/07/2014   HCT 40.8 09/25/2018   HCT 43.1 09/07/2014   PLT 230 09/25/2018   PLT 200 09/07/2014   LYMPHOPCT 30 09/24/2018   LYMPHOPCT 37.0 06/19/2014   MONOPCT 6 09/24/2018   MONOPCT 4.4 06/19/2014   EOSPCT 2 09/24/2018   EOSPCT 1.7 06/19/2014   BASOPCT 0 09/24/2018   BASOPCT 2.1 06/19/2014   CMP:  Lab Results  Component Value Date   NA 136 09/25/2018   NA 140 09/07/2014   K 3.5 09/25/2018   K 3.9 09/07/2014   CL 105 09/25/2018   CL 109 09/07/2014   CO2 24 09/25/2018   CO2 26 09/07/2014   BUN 7 09/25/2018  BUN 7 09/07/2014   CREATININE 0.99 09/25/2018   CREATININE 1.01 09/07/2014   PROT 7.0 09/24/2018   PROT 6.3 (L) 09/07/2014   ALBUMIN 4.0 09/24/2018   ALBUMIN 3.9 09/07/2014   BILITOT 0.6 09/24/2018   BILITOT <0.1 (L) 09/07/2014   ALKPHOS 47 09/24/2018   ALKPHOS 50 09/07/2014   AST 23 09/24/2018   AST 22 09/07/2014   ALT 27 09/24/2018   ALT  32 09/07/2014  .  Micro Results Recent Results (from the past 240 hour(s))  Gastrointestinal Panel by PCR , Stool     Status: None   Collection Time: 09/26/18 12:32 AM  Result Value Ref Range Status   Campylobacter species NOT DETECTED NOT DETECTED Final   Plesimonas shigelloides NOT DETECTED NOT DETECTED Final   Salmonella species NOT DETECTED NOT DETECTED Final   Yersinia enterocolitica NOT DETECTED NOT DETECTED Final   Vibrio species NOT DETECTED NOT DETECTED Final   Vibrio cholerae NOT DETECTED NOT DETECTED Final   Enteroaggregative E coli (EAEC) NOT DETECTED NOT DETECTED Final   Enteropathogenic E coli (EPEC) NOT DETECTED NOT DETECTED Final   Enterotoxigenic E coli (ETEC) NOT DETECTED NOT DETECTED Final   Shiga like toxin producing E coli (STEC) NOT DETECTED NOT DETECTED Final   Shigella/Enteroinvasive E coli (EIEC) NOT DETECTED NOT DETECTED Final   Cryptosporidium NOT DETECTED NOT DETECTED Final   Cyclospora cayetanensis NOT DETECTED NOT DETECTED Final   Entamoeba histolytica NOT DETECTED NOT DETECTED Final   Giardia lamblia NOT DETECTED NOT DETECTED Final   Adenovirus F40/41 NOT DETECTED NOT DETECTED Final   Astrovirus NOT DETECTED NOT DETECTED Final   Norovirus GI/GII NOT DETECTED NOT DETECTED Final   Rotavirus A NOT DETECTED NOT DETECTED Final   Sapovirus (I, II, IV, and V) NOT DETECTED NOT DETECTED Final    Comment: Performed at [facility], [address] [place], [place] [address]        Code Status Orders  (From admission, onward)         Start     Ordered   09/24/18 1206  Full code  Continuous     09/24/18 1205        Code Status History    This patient has a current code status but no historical code status.          Follow-up Information    Jonathon Bellows, MD. Go on 10/04/2018.   Specialty:  Gastroenterology Why:  at 9:30 - e-visit - call office with cell phone number - hosp f/u abdominal pain  Contact information: [place] [address] [phone]           Discharge Medications   Allergies as of 09/26/2018      Reactions   Mushroom Extract Complex Anaphylaxis   Lactose Intolerance (gi) Nausea And Vomiting   Normal Saline [sodium Chloride] Other (See Comments)   Testicle swelling   Penicillins Nausea And Vomiting   Has patient had a PCN reaction causing immediate rash, facial/tongue/throat swelling, SOB or lightheadedness with hypotension: No Has patient had a PCN reaction causing severe rash involving mucus membranes or skin necrosis: No Has patient had a PCN reaction that required hospitalization No Has patient had a PCN reaction occurring within the last 10 years: Yes If all of the above answers are "NO", then may proceed with Cephalosporin use.   Prednisone Itching, Other (See Comments)   Swelling of testical      Medication List  STOP taking these medications   ibuprofen 600 MG tablet Commonly known as:  ADVIL   Mobic 15 MG tablet Generic drug:  meloxicam   mupirocin ointment 2 % Commonly known as:  BACTROBAN   ondansetron 4 MG tablet Commonly known as:  Zofran   traMADol 50 MG tablet Commonly known as:  Ultram     TAKE these medications   ciprofloxacin 500 MG tablet Commonly known as:  Cipro Take 1 tablet (500 mg total) by mouth 2 (two) times daily for 5 days.   dicyclomine 20 MG tablet Commonly known as:  Bentyl Take 1 tablet (20 mg total) by mouth 3 (three) times daily as needed for spasms.   oxyCODONE-acetaminophen 5-325 MG tablet Commonly known as:  PERCOCET/ROXICET Take 1 tablet by mouth every 6 (six) hours as needed for moderate pain.   pantoprazole 40 MG tablet Commonly known as:  Protonix Take 1 tablet (40 mg total) by mouth daily.          Total Time in preparing paper work, data evaluation and todays exam - 60 minutes  Dustin Flock M.D on 09/26/2018 at 12:36 [place]  [phone]

## 2018-09-26 NOTE — Plan of Care (Signed)
  Problem: Clinical Measurements: Goal: Ability to maintain clinical measurements within normal limits will improve Outcome: Progressing   Problem: Nutrition: Goal: Adequate nutrition will be maintained Outcome: Progressing   Problem: Pain Managment: Goal: General experience of comfort will improve Outcome: Progressing   Problem: Safety: Goal: Ability to remain free from injury will improve Outcome: Progressing   

## 2018-09-26 NOTE — Progress Notes (Signed)
Pt for discharge home a/o. No distress. Instructions discussed with pt/ diet activity meds and f/u discussed.  Iv site d/cd.  Verbalize understanding .

## 2018-10-02 ENCOUNTER — Emergency Department
Admission: EM | Admit: 2018-10-02 | Discharge: 2018-10-02 | Disposition: A | Discharge status: Home / Self Care | Payer: Medicaid Other | Attending: Emergency Medicine | Admitting: Emergency Medicine

## 2018-10-02 ENCOUNTER — Other Ambulatory Visit: Payer: Self-pay

## 2018-10-02 ENCOUNTER — Emergency Department: Payer: Medicaid Other

## 2018-10-02 DIAGNOSIS — K640 First degree hemorrhoids: Secondary | ICD-10-CM | POA: Diagnosis not present

## 2018-10-02 DIAGNOSIS — K5903 Drug induced constipation: Secondary | ICD-10-CM | POA: Diagnosis not present

## 2018-10-02 DIAGNOSIS — F17210 Nicotine dependence, cigarettes, uncomplicated: Secondary | ICD-10-CM | POA: Insufficient documentation

## 2018-10-02 DIAGNOSIS — K5900 Constipation, unspecified: Secondary | ICD-10-CM | POA: Diagnosis present

## 2018-10-02 MED ORDER — TRAMADOL HCL 50 MG PO TABS: 50.0000 mg | ORAL_TABLET | Freq: Two times a day (BID) | ORAL | 0 refills | Status: AC | PRN

## 2018-10-02 MED ORDER — DIBUCAINE 1 % RE OINT: 1.0000 "application " | TOPICAL_OINTMENT | RECTAL | 0 refills | Status: AC | PRN

## 2018-10-02 MED ORDER — HYDROCORTISONE 2.5 % RE CREA: 1.0000 "application " | TOPICAL_CREAM | Freq: Two times a day (BID) | RECTAL | 0 refills | Status: AC

## 2018-10-02 MED ORDER — SORBITOL 70 % SOLN
960.0000 mL | TOPICAL_OIL | Freq: Once | ORAL | Status: AC
  Administered 2018-10-02: 960 mL via RECTAL
  Filled 2018-10-02: qty 473

## 2018-10-02 NOTE — Discharge Instructions (Addendum)
The Percocet is making you constipated.  I am giving you a couple doses of tramadol but only take them if you absolutely have to for pain.  These will make you constipated as well.  Continue using your stool softeners.  You can use hydrocortisone and dibucaine cream for your hemorrhoids.  Continue sitz bath's.  Please follow-up with your GI appointment on Thursday morning.  Call them today to confirm.

## 2018-10-02 NOTE — ED Triage Notes (Signed)
Pt was admitted with colitis a week ago, pt is back today with c/o constipation with hemorrhoid pain. Unable to sit in triage

## 2018-10-02 NOTE — ED Notes (Signed)
approx 100 ml of enema given  Tolerated well

## 2018-10-02 NOTE — ED Provider Notes (Signed)
[facility] Emergency Department Provider Note  ____________________________________________  Time seen: Approximately 7:40 AM  I have reviewed the triage vital signs and the nursing notes.   HISTORY  Chief Complaint Constipation and Hemorrhoids    HPI Wesley Hart is a 33 y.o. male that presents to the emergency department for evaluation of constipation and hemorrhoid.  Patient states that he was discharged from the hospital after a bout of colitis last Wednesday and has not had a bowel movement since then.  He has developed a hemorrhoid from the straining.  No history of hemorrhoids.  He is taking Percocet for pain.  He has tried 2 laxatives, a stool softener and a suppository without success.  No fever, nausea, vomiting, abdominal pain.  Past Medical History:  Diagnosis Date  . Lactose intolerance   . Stomach ulcer     Patient Active Problem List   Diagnosis Date Noted  . Abdominal pain 09/24/2018    Past Surgical History:  Procedure Laterality Date  . FRACTURE SURGERY  2005  . KNEE ARTHROSCOPY Left 09/22/2016   Procedure: ARTHROSCOPY KNEE;  Surgeon: Corky Mull, MD;  Location: [facility] ORS;  Service: Orthopedics;  Laterality: Left;  . ORIF TIBIA PLATEAU Left 09/22/2016   Procedure: OPEN REDUCTION INTERNAL FIXATION (ORIF) TIBIAL PLATEAU;  Surgeon: Corky Mull, MD;  Location: [facility] ORS;  Service: Orthopedics;  Laterality: Left;    Prior to Admission medications   Medication Sig Start Date End Date Taking? Authorizing Provider  dibucaine (NUPERCAINAL) 1 % OINT Place 1 application rectally as needed for hemorrhoids. 10/02/18   Laban Emperor, PA-C  dicyclomine (BENTYL) 20 MG tablet Take 1 tablet (20 mg total) by mouth 3 (three) times daily as needed for spasms. 09/26/18 09/26/19  Dustin Flock, MD  hydrocortisone (ANUSOL-HC) 2.5 % rectal cream Place 1 application rectally 2 (two) times daily. 10/02/18   Laban Emperor, PA-C  oxyCODONE-acetaminophen  (PERCOCET/ROXICET) 5-325 MG tablet Take 1 tablet by mouth every 6 (six) hours as needed for moderate pain. 09/26/18   Dustin Flock, MD  pantoprazole (PROTONIX) 40 MG tablet Take 1 tablet (40 mg total) by mouth daily. 09/26/18 11/25/18  Dustin Flock, MD  traMADol (ULTRAM) 50 MG tablet Take 1 tablet (50 mg total) by mouth every 12 (twelve) hours as needed. 10/02/18 10/02/19  Laban Emperor, PA-C    Allergies Mushroom extract complex; Lactose intolerance (gi); Normal saline [sodium chloride]; Penicillins; and Prednisone  Family History  Problem Relation Age of Onset  . Hypertension Mother     Social History Social History   Tobacco Use  . Smoking status: Current Every Day Smoker    Packs/day: 0.25    Years: 18.00    Pack years: 4.50    Types: Cigarettes  . Smokeless tobacco: Never Used  Substance Use Topics  . Alcohol use: Yes    Comment: 1-2 BEERS weekly  . Drug use: Yes    Types: Marijuana    Comment: PT DENIES THIS DURING PHONE INTERVIEW (09-20-16)     Review of Systems  Constitutional: No fever/chills Cardiovascular: No chest pain. Respiratory: No SOB. Gastrointestinal: No abdominal pain.  No nausea, no vomiting.  Musculoskeletal: Negative for musculoskeletal pain. Skin: Negative for rash, abrasions, lacerations, ecchymosis. Neurological: Negative for headaches   ____________________________________________   PHYSICAL EXAM:  VITAL SIGNS: ED Triage Vitals  Enc Vitals Group     BP 10/02/18 0732 126/70     Pulse Rate 10/02/18 0732 63     Resp 10/02/18 0732 18  Temp 10/02/18 0732 98.4 F (36.9 C)     Temp Source 10/02/18 0732 Oral     SpO2 10/02/18 0732 97 %     Weight 10/02/18 0727 235 lb (106.6 kg)     Height 10/02/18 0727 5\' 10"  (1.778 m)     Head Circumference --      Peak Flow --      Pain Score 10/02/18 0727 10     Pain Loc --      Pain Edu? --      Excl. in [place]? --      Constitutional: Alert and oriented. Well appearing and in no acute  distress. Eyes: Conjunctivae are normal. PERRL. EOMI. Head: Atraumatic. ENT:      Ears:      Nose: No congestion/rhinnorhea.      Mouth/Throat: Mucous membranes are moist.  Neck: No stridor.  Cardiovascular: Normal rate, regular rhythm.  Good peripheral circulation. Respiratory: Normal respiratory effort without tachypnea or retractions. Lungs CTAB. Good air entry to the bases with no decreased or absent breath sounds. Gastrointestinal: Bowel sounds 4 quadrants. Soft and nontender to palpation. No guarding or rigidity. No palpable masses. No distention.  Genitourinary: 1cm x 1 cm swollen, inflamed, tender skin to 6 PM position of rectum.  No blood clot.  No surrounding erythema. Musculoskeletal: Full range of motion to all extremities. No gross deformities appreciated. Neurologic:  Normal speech and language. No gross focal neurologic deficits are appreciated.  Skin:  Skin is warm, dry and intact. No rash noted. Psychiatric: Mood and affect are normal. Speech and behavior are normal. Patient exhibits appropriate insight and judgement.   ____________________________________________   LABS (all labs ordered are listed, but only abnormal results are displayed)  Labs Reviewed - No data to display ____________________________________________  EKG   ____________________________________________  RADIOLOGY Robinette Haines, personally viewed and evaluated these images (plain radiographs) as part of my medical decision making, as well as reviewing the written report by the radiologist.   Dg Abdomen 1 View  Result Date: 10/02/2018 CLINICAL DATA:  33 year old male with constipation. EXAM: ABDOMEN - 1 VIEW COMPARISON:  09/24/2018 exams. FINDINGS: A moderate amount of stool throughout the colon noted. Gas in the rectum is identified. No dilated bowel loops are present. RIGHT nephrolithiasis again identified. No bony abnormalities are noted. IMPRESSION: Moderate colonic stool which can be seen  with constipation. No evidence of bowel obstruction. Electronically Signed   By: Margarette Canada M.D.   On: 10/02/2018 08:14    ____________________________________________    PROCEDURES  Procedure(s) performed:    Procedures    Medications  sorbitol, milk of mag, mineral oil, glycerin (SMOG) enema (960 mLs Rectal Given 10/02/18 0912)     ____________________________________________   INITIAL IMPRESSION / ASSESSMENT AND PLAN / ED COURSE  Pertinent labs & imaging results that were available during my care of the patient were reviewed by me and considered in my medical decision making (see chart for details).  Review of the [place] CSRS was performed in accordance of the [place] prior to dispensing any controlled drugs.     Patient presented the emergency department for evaluation of hemorrhoids and constipation.  Constipation likely from Percocet use due to hemorrhoids the last several days.  Patient had a large bowel movement after half of a smog enema.  He does not wish to receive the rest of the enema.  He feels better.  Patient will be given a very short course of tramadol that he will use  for extreme pain of the hemorrhoids but understands that this will likely cause constipation as well and will avoid using it today.  He will continue using his over-the-counter stool softeners.  Patient will be discharged home with prescriptions for dibucaine ointment, hydrocortisone cream, tramadol. Patient is to follow up with GI as directed.  He has an appointment with GI on Thursday.  Patient is given ED precautions to return to the ED for any worsening or new symptoms.     ____________________________________________  FINAL CLINICAL IMPRESSION(S) / ED DIAGNOSES  Final diagnoses:  Drug-induced constipation  Grade I hemorrhoids      NEW MEDICATIONS STARTED DURING THIS VISIT:  ED Discharge Orders         Ordered    traMADol (ULTRAM) 50 MG tablet  Every 12 hours PRN     10/02/18 0956     dibucaine (NUPERCAINAL) 1 % OINT  As needed     10/02/18 0956    hydrocortisone (ANUSOL-HC) 2.5 % rectal cream  2 times daily     10/02/18 2878              This chart was dictated using voice recognition software/Dragon. Despite best efforts to proofread, errors can occur which can change the meaning. Any change was purely unintentional.    Laban Emperor, PA-C 10/02/18 1559    Harvest Dark, MD 10/03/18 [phone]

## 2018-10-02 NOTE — ED Notes (Signed)
Up to bathroom  Positive results   Provider aware

## 2018-10-02 NOTE — ED Notes (Signed)
See triage note  States he was discharged from the hospital last weds  Was told to do liquid diet and to advance as tolerated   States he has tried a stool softener and a laxative w/o relief    Positive nausea only   No vomiting

## 2018-10-04 ENCOUNTER — Telehealth: Payer: Self-pay | Admitting: Gastroenterology

## 2018-10-04 ENCOUNTER — Other Ambulatory Visit: Payer: Self-pay

## 2018-10-04 ENCOUNTER — Ambulatory Visit (INDEPENDENT_AMBULATORY_CARE_PROVIDER_SITE_OTHER): Payer: Medicaid Other | Admitting: Gastroenterology

## 2018-10-04 ENCOUNTER — Encounter: Payer: Self-pay | Admitting: Emergency Medicine

## 2018-10-04 ENCOUNTER — Emergency Department
Admission: EM | Admit: 2018-10-04 | Discharge: 2018-10-04 | Disposition: A | Discharge status: Home / Self Care | Payer: Medicaid Other | Attending: Emergency Medicine | Admitting: Emergency Medicine

## 2018-10-04 DIAGNOSIS — I10 Essential (primary) hypertension: Secondary | ICD-10-CM | POA: Insufficient documentation

## 2018-10-04 DIAGNOSIS — R933 Abnormal findings on diagnostic imaging of other parts of digestive tract: Secondary | ICD-10-CM

## 2018-10-04 DIAGNOSIS — K6289 Other specified diseases of anus and rectum: Secondary | ICD-10-CM | POA: Diagnosis present

## 2018-10-04 DIAGNOSIS — F17210 Nicotine dependence, cigarettes, uncomplicated: Secondary | ICD-10-CM | POA: Diagnosis not present

## 2018-10-04 DIAGNOSIS — Z1159 Encounter for screening for other viral diseases: Secondary | ICD-10-CM | POA: Diagnosis not present

## 2018-10-04 DIAGNOSIS — K649 Unspecified hemorrhoids: Secondary | ICD-10-CM | POA: Diagnosis not present

## 2018-10-04 DIAGNOSIS — Z01812 Encounter for preprocedural laboratory examination: Secondary | ICD-10-CM

## 2018-10-04 MED ORDER — NA SULFATE-K SULFATE-MG SULF 17.5-3.13-1.6 GM/177ML PO SOLN: 1.0000 | Freq: Once | ORAL | 0 refills | Status: AC

## 2018-10-04 MED ORDER — LIDOCAINE HCL URETHRAL/MUCOSAL 2 % EX GEL: 1.0000 "application " | Freq: Once | CUTANEOUS | Status: DC

## 2018-10-04 MED ORDER — HYDROCORT-PRAMOXINE (PERIANAL) 1-1 % EX FOAM: 1.0000 | Freq: Four times a day (QID) | CUTANEOUS | 0 refills | Status: AC

## 2018-10-04 MED ORDER — OXYCODONE-ACETAMINOPHEN 5-325 MG PO TABS
1.0000 | ORAL_TABLET | Freq: Once | ORAL | Status: AC
  Administered 2018-10-04: 1 via ORAL
  Filled 2018-10-04: qty 1

## 2018-10-04 MED ORDER — PRAMOXINE-HC 1-1 % EX FOAM
1.0000 | Freq: Two times a day (BID) | CUTANEOUS | Status: DC
  Administered 2018-10-04: 1 via TOPICAL
  Filled 2018-10-04: qty 10

## 2018-10-04 MED ORDER — LIDOCAINE 4 % EX CREA
TOPICAL_CREAM | Freq: Once | CUTANEOUS | Status: AC
  Administered 2018-10-04: 1 via TOPICAL
  Filled 2018-10-04: qty 5

## 2018-10-04 MED ORDER — HYDROCORTISONE ACETATE 25 MG RE SUPP
25.0000 mg | Freq: Once | RECTAL | Status: DC
  Filled 2018-10-04: qty 1

## 2018-10-04 NOTE — Progress Notes (Signed)
Wesley Hart , MD [address]  [place]  [place], [place] [address]  Main: [phone]  Fax: [phone]   Primary Care Physician: Patient, No Pcp Per  Virtual Visit via Video Note  I connected with patient on 10/04/18 at  9:30 AM EDT by video and verified that I am speaking with the correct person using two identifiers.   I discussed the limitations, risks, security and privacy concerns of performing an evaluation and management service by video  and the availability of in person appointments. I also discussed with the patient that there may be a patient responsible charge related to this service. The patient expressed understanding and agreed to proceed.  Location of Patient: Home Location of Provider: Home Persons involved: Patient and provider only   History of Present Illness: Chief Complaint  Patient presents with  . Hospitalization Follow-up    Abdominal pain    HPI: Wesley Hart is a 33 y.o. male   Summary of history :  He is here today to follow up to his recent hospital discharge on 09/26/2018 when he was admitted and seen by myself for abdominal pain . The pain began the day prior acutely,didn't get better , hence came into the hospital. Generalized, non radiating . Similar presentation in 04/2018 at the ER -attributed to issues with lactose intolerance . CT scan demonstrated enteritis - was suggested outpatient GI follow up but didn't do so .  On this presentation he also underwent a CT scan of the abdomen which showed features of enteritis- particularly affecting the distal terminal ileum,similr in presentation to scan in 04/2018.  He has had a few ER visits last on 5/122020 for constipation and hemorrhoids. He takes percocet's, did not have a bowel movement and went to ER. X ray abdomen showed moderate stool . Was given cream for hemorroids and D.c  I intended to perform a colonoscopy with TI biopsies but since his urine showed cocaine he was  discharged with a course of Ciproflixacin/Flagyl and asked to follow up   Interval history   09/25/2018-  10/04/2018   Denies any abdominal pain, issues with constipation. Takes stool softeners and dulcolax. Working at this time.   No exposure to cocaine . Still using marijuana.      Current Outpatient Medications  Medication Sig Dispense Refill  . dibucaine (NUPERCAINAL) 1 % OINT Place 1 application rectally as needed for hemorrhoids. 28 g 0  . dicyclomine (BENTYL) 20 MG tablet Take 1 tablet (20 mg total) by mouth 3 (three) times daily as needed for spasms. 30 tablet 0  . hydrocortisone (ANUSOL-HC) 2.5 % rectal cream Place 1 application rectally 2 (two) times daily. 30 g 0  . pantoprazole (PROTONIX) 40 MG tablet Take 1 tablet (40 mg total) by mouth daily. 30 tablet 1  . traMADol (ULTRAM) 50 MG tablet Take 1 tablet (50 mg total) by mouth every 12 (twelve) hours as needed. 6 tablet 0  . oxyCODONE-acetaminophen (PERCOCET/ROXICET) 5-325 MG tablet Take 1 tablet by mouth every 6 (six) hours as needed for moderate pain. (Patient not taking: Reported on 10/04/2018) 30 tablet 0   No current facility-administered medications for this visit.     Allergies as of 10/04/2018 - Review Complete 10/04/2018  Allergen Reaction Noted  . Mushroom extract complex Anaphylaxis 09/19/2016  . Lactose intolerance (gi) Nausea And Vomiting 10/13/2014  . Normal saline [sodium chloride] Other (See Comments) 05/15/2018  . Penicillins Nausea And Vomiting 09/19/2016  . Prednisone Itching and  Other (See Comments) 12/06/2014    Review of Systems:    All systems reviewed and negative except where noted in HPI.  General Appearance:    Alert, cooperative, no distress, appears stated age  Head:    Normocephalic, without obvious abnormality, atraumatic  Eyes:    PERRL, conjunctiva/corneas clear,  Ears:    Grossly normal hearing    Neurologic:  Grossly normal    Observations/Objective:  Labs: CMP     Component  Value Date/Time   NA 136 09/25/2018 0259   NA 140 09/07/2014 1544   K 3.5 09/25/2018 0259   K 3.9 09/07/2014 1544   CL 105 09/25/2018 0259   CL 109 09/07/2014 1544   CO2 24 09/25/2018 0259   CO2 26 09/07/2014 1544   GLUCOSE 92 09/25/2018 0259   GLUCOSE 92 09/07/2014 1544   BUN 7 09/25/2018 0259   BUN 7 09/07/2014 1544   CREATININE 0.99 09/25/2018 0259   CREATININE 1.01 09/07/2014 1544   CALCIUM 8.2 (L) 09/25/2018 0259   CALCIUM 8.7 (L) 09/07/2014 1544   PROT 7.0 09/24/2018 0749   PROT 6.3 (L) 09/07/2014 1544   ALBUMIN 4.0 09/24/2018 0749   ALBUMIN 3.9 09/07/2014 1544   AST 23 09/24/2018 0749   AST 22 09/07/2014 1544   ALT 27 09/24/2018 0749   ALT 32 09/07/2014 1544   ALKPHOS 47 09/24/2018 0749   ALKPHOS 50 09/07/2014 1544   BILITOT 0.6 09/24/2018 0749   BILITOT <0.1 (L) 09/07/2014 1544   GFRNONAA >60 09/25/2018 0259   GFRNONAA >60 09/07/2014 1544   GFRAA >60 09/25/2018 0259   GFRAA >60 09/07/2014 1544   Lab Results  Component Value Date   WBC 6.2 09/25/2018   HGB 13.5 09/25/2018   HCT 40.8 09/25/2018   MCV 88.7 09/25/2018   PLT 230 09/25/2018    Imaging Studies: Dg Abdomen 1 View  Result Date: 10/02/2018 CLINICAL DATA:  33 year old male with constipation. EXAM: ABDOMEN - 1 VIEW COMPARISON:  09/24/2018 exams. FINDINGS: A moderate amount of stool throughout the colon noted. Gas in the rectum is identified. No dilated bowel loops are present. RIGHT nephrolithiasis again identified. No bony abnormalities are noted. IMPRESSION: Moderate colonic stool which can be seen with constipation. No evidence of bowel obstruction. Electronically Signed   By: Margarette Canada M.D.   On: 10/02/2018 08:14   Ct Abdomen Pelvis W Contrast  Result Date: 09/24/2018 CLINICAL DATA:  Acute generalized abdominal pain. EXAM: CT ABDOMEN AND PELVIS WITH CONTRAST TECHNIQUE: Multidetector CT imaging of the abdomen and pelvis was performed using the standard protocol following bolus administration of  intravenous contrast. CONTRAST:  155mL OMNIPAQUE IOHEXOL 300 MG/ML  SOLN COMPARISON:  05/14/2018; 09/14/2016 FINDINGS: Lower chest: Limited visualization of the lower thorax demonstrates minimal dependent subsegmental atelectasis, most conspicuous within the right lower lobe. No focal airspace opacities. No pleural effusion. Normal heart size.  No pericardial effusion. Hepatobiliary: Normal hepatic contour. There is a minimal amount focal fatty infiltration adjacent to the fissure for the ligamentum teres. No discrete hepatic lesions. Normal appearance of the gallbladder given degree distention. No radiopaque gallstones. No intra or extrahepatic biliary dilatation. No ascites. Pancreas: Normal appearance of the pancreas. Spleen: Normal appearance of the spleen. Adrenals/Urinary Tract: There is symmetric enhancement of the bilateral kidneys. Note is made of 2 adjacent punctate (approximately 4 mm) nonobstructing stones within the interpolar aspect the right kidney (images 31 and 32, series 2). Note is made of several bilateral punctate hypoattenuating renal lesions, right greater than  left, too small to accurately characterize of favored to represent renal cysts. No urine obstruction or perinephric stranding. Normal appearance the bilateral adrenal glands. Normal appearance of the urinary bladder given degree distention. Stomach/Bowel: Mild fluid distension of the tip of the cecum without associated adjacent inflammatory change. Normal appearance of the retrocecal appendix (best seen on coronal images 37 through 44, series 5). Potential mild circumferential wall thickening involving several loops of terminal ileum (representative coronal images 35 and 38, series 5), similar to abdominal CT performed 05/14/2018 and again without associated upstream dilatation to suggest enteric obstruction. The bowel otherwise in appears normal in course and caliber. No pneumoperitoneum, pneumatosis or portal venous gas. Small hiatal  hernia. Vascular/Lymphatic: Normal caliber the abdominal aorta. The major branch vessels of the abdominal aorta appear patent on this non CTA examination. No bulky retroperitoneal, mesenteric, pelvic or inguinal lymphadenopathy. Reproductive: Normal appearance the prostate gland. There is a trace amount of presumably reactive free fluid is seen with the pelvic cul-de-sac. Other: Regional soft tissues appear normal. Musculoskeletal: No acute or aggressive osseous abnormalities. Normal appearance of the bilateral SI joints without CT evidence of sacroiliitis. Note is made of a small right-sided os acetabuli. IMPRESSION: 1. Mild circumferential wall thickening involving several loops of distal small bowel, similar to the 04/2018 examination and without evidence of enteric obstruction. Findings are nonspecific though could be seen in the setting of an inflammatory enteritis (such as Crohn's colitis). 2. Otherwise, no explanation for patient's diffuse abdominal pain. Specifically, normal appearance of the appendix. No evidence of enteric or urinary obstruction. 3. Nonobstructing right-sided nephrolithiasis. Electronically Signed   By: Sandi Mariscal M.D.   On: 09/24/2018 10:37   Dg Abd 2 Views  Result Date: 09/24/2018 CLINICAL DATA:  Abdominal pain EXAM: ABDOMEN - 2 VIEW COMPARISON:  05/14/2018 abdominal CT FINDINGS: There are a few air-fluid levels in the right abdomen and mildly distended small bowel loops in the left abdomen. 2 right renal calculi measuring up to 5 mm. Negative for pneumoperitoneum. Lung bases are clear. IMPRESSION: 1. Mildly distended small bowel loops with scattered fluid levels. Enteritis or early partial bowel obstruction could have this appearance. 2. Right nephrolithiasis. Electronically Signed   By: Monte Fantasia M.D.   On: 09/24/2018 08:09    Assessment and Plan:   RODEL GLASPY is a 33 y.o. y/o male here to follow up for  abdominal pain-recent hospitalization and discharge on  09/26/2018 , imaging suggestive of enteritis. Differentials include infection but Crohn's disease also needs to be ruled out. CRP normal. Due to cocaine in urine couldn't  perform endoscopy while in hospital. Subsequent ER visit for pain and constipation    Plan  1. Diagnostic colonoscopy. Urine drug test on day of procedure 2. Continue use of laxatives for constipation.  I have discussed alternative options, risks & benefits,  which include, but are not limited to, bleeding, infection, perforation,respiratory complication & drug reaction.  The patient agrees with this plan & written consent will be obtained.       I discussed the assessment and treatment plan with the patient. The patient was provided an opportunity to ask questions and all were answered. The patient agreed with the plan and demonstrated an understanding of the instructions.   The patient was advised to call back or seek an in-person evaluation if the symptoms worsen or if the condition fails to improve as anticipated.    Dr Wesley Bellows MD,MRCP [facility]) Gastroenterology/Hepatology Pager: [phone]   Speech recognition software  was used to dictate this note.

## 2018-10-04 NOTE — Telephone Encounter (Signed)
Patient called in a lot of pain stating his girlfriend looked @ the hemorrhoid & it had a whole with puss coming out, He is on his way to the ED @ [facility]

## 2018-10-04 NOTE — ED Provider Notes (Signed)
Patient seen and examined by me as well.  He has 1 hemorrhoid that is very firm and very tender.  Discussed with him I&D in the hemorrhoid he does not want to do that at this time.  We will treat him medically.   Nena Polio, MD 10/04/18 [phone]

## 2018-10-04 NOTE — Telephone Encounter (Signed)
Pt is calling for Wesley Hart to schedule procedure he is aware Private # will call him

## 2018-10-04 NOTE — ED Triage Notes (Signed)
Pt here for hemorrhoid pain, denies constipation, last bm 0400 this am. Was admitted with colitis about a week ago. NAD.

## 2018-10-04 NOTE — Progress Notes (Signed)
Pt is aware of the requirements to complete the COVID-19 lab test 4 days prior to colonoscopy procedure. Pt is aware of testing site location, day and time. Pt agrees.

## 2018-10-04 NOTE — ED Provider Notes (Signed)
[facility] Emergency Department Provider Note ____________________________________________  Time seen: 1230  I have reviewed the triage vital signs and the nursing notes.  HISTORY  Chief Complaint  Rectal Pain  HPI Wesley Hart is a 33 y.o. male returns to the ED, via EMS, for evaluation of ongoing rectal pain.  Patient was diagnosed with hemorrhoids 3 days prior, after an ED admission for colitis.  He has been treating at home with stool softeners daily, hydrocortisone cream topically, Preparation-H suppositories, dibucaine ointment, and Tramadol.   Past Medical History:  Diagnosis Date  . Lactose intolerance   . Stomach ulcer     Patient Active Problem List   Diagnosis Date Noted  . Abdominal pain 09/24/2018    Past Surgical History:  Procedure Laterality Date  . FRACTURE SURGERY  2005  . KNEE ARTHROSCOPY Left 09/22/2016   Procedure: ARTHROSCOPY KNEE;  Surgeon: Corky Mull, MD;  Location: [facility] ORS;  Service: Orthopedics;  Laterality: Left;  . ORIF TIBIA PLATEAU Left 09/22/2016   Procedure: OPEN REDUCTION INTERNAL FIXATION (ORIF) TIBIAL PLATEAU;  Surgeon: Corky Mull, MD;  Location: [facility] ORS;  Service: Orthopedics;  Laterality: Left;    Prior to Admission medications   Medication Sig Start Date End Date Taking? Authorizing Provider  dibucaine (NUPERCAINAL) 1 % OINT Place 1 application rectally as needed for hemorrhoids. 10/02/18  Yes Laban Emperor, PA-C  dicyclomine (BENTYL) 20 MG tablet Take 1 tablet (20 mg total) by mouth 3 (three) times daily as needed for spasms. 09/26/18 09/26/19 Yes Dustin Flock, MD  hydrocortisone (ANUSOL-HC) 2.5 % rectal cream Place 1 application rectally 2 (two) times daily. 10/02/18  Yes Laban Emperor, PA-C  Na Sulfate-K Sulfate-Mg Sulf 17.5-3.13-1.6 GM/177ML SOLN Take 1 kit by mouth once for 1 dose. 10/04/18 10/04/18 Yes Jonathon Bellows, MD  pantoprazole (PROTONIX) 40 MG tablet Take 1 tablet (40 mg total) by mouth daily. 09/26/18  11/25/18 Yes Dustin Flock, MD  traMADol (ULTRAM) 50 MG tablet Take 1 tablet (50 mg total) by mouth every 12 (twelve) hours as needed. 10/02/18 10/02/19 Yes Laban Emperor, PA-C  hydrocortisone-pramoxine (PROCTOFOAM [facility]) rectal foam Place 1 applicator rectally 4 (four) times daily for 7 days. 10/04/18 10/11/18  Suzana Sohail, Dannielle Karvonen, PA-C    Allergies Mushroom extract complex; Lactose intolerance (gi); Normal saline [sodium chloride]; Penicillins; and Prednisone  Family History  Problem Relation Age of Onset  . Hypertension Mother     Social History Social History   Tobacco Use  . Smoking status: Current Every Day Smoker    Packs/day: 0.25    Years: 18.00    Pack years: 4.50    Types: Cigarettes  . Smokeless tobacco: Never Used  Substance Use Topics  . Alcohol use: Yes    Comment: 1-2 BEERS weekly  . Drug use: Yes    Types: Marijuana    Comment: PT DENIES THIS DURING PHONE INTERVIEW (09-20-16)    Review of Systems  Constitutional: Negative for fever. Eyes: Negative for visual changes. ENT: Negative for sore throat. Cardiovascular: Negative for chest pain. Respiratory: Negative for shortness of breath. Gastrointestinal: Negative for abdominal pain, vomiting and diarrhea. Rectal pain as above.  Genitourinary: Negative for dysuria. Musculoskeletal: Negative for back pain. Skin: Negative for rash. Neurological: Negative for headaches, focal weakness or numbness. ____________________________________________  PHYSICAL EXAM:  VITAL SIGNS: ED Triage Vitals [10/04/18 1207]  Enc Vitals Group     BP (!) 155/80     Pulse Rate 82     Resp 16  Temp 98.6 F (37 C)     Temp Source Oral     SpO2 99 %     Weight 245 lb (111.1 kg)     Height '5\' 11"'  (1.803 m)     Head Circumference      Peak Flow      Pain Score 10     Pain Loc      Pain Edu?      Excl. in [place]?     Constitutional: Alert and oriented. Well appearing and in no distress. Head: Normocephalic and  atraumatic. Eyes: Conjunctivae are normal. Normal extraocular movements Cardiovascular: Normal rate, regular rhythm. Normal distal pulses. Respiratory: Normal respiratory effort. No wheezes/rales/rhonchi. Gastrointestinal: Soft and nontender. No distention. Rectal exam reveals 2 firm external hemorrhoids without obvious strangulation or thrombosis.  Musculoskeletal: Nontender with normal range of motion in all extremities.  Neurologic:  Normal gait without ataxia. Normal speech and language. No gross focal neurologic deficits are appreciated. Skin:  Skin is warm, dry and intact. No rash noted. Psychiatric: Mood and affect are normal. Patient exhibits appropriate insight and judgment. ____________________________________________   LABS (pertinent positives/negatives) Labs Reviewed  NOVEL CORONAVIRUS, NAA (HOSPITAL ORDER, SEND-OUT TO REF LAB)  ____________________________________________  PROCEDURES  Procedures Percocet 5-325 mg PO Lidocaine 4 % topically Proctofoam 1 PR ____________________________________________  INITIAL IMPRESSION / ASSESSMENT AND PLAN / ED COURSE  [name] was evaluated in Emergency Department on 10/04/2018 for the symptoms described in the history of present illness. He was evaluated in the context of the global COVID-19 pandemic, which necessitated consideration that the patient might be at risk for infection with the SARS-CoV-2 virus that causes COVID-19. Institutional protocols and algorithms that pertain to the evaluation of patients at risk for COVID-19 are in a state of rapid change based on information released by regulatory bodies including the CDC and federal and state organizations. These policies and algorithms were followed during the patient's care in the ED.  Patient with ED evaluation of rectal pain for 3 days secondary to external hemorrhoids.  Patient's exam reveals 2-3 firm but not thrombosed hemorrhoids.  Patient has had several small soft  stools during his course in the ED.  He was given the option of a local hemorrhoid incision procedure, but declined. He is reporting some improvement after administration of Proctofoam and topical lidocaine cream.  He will again follow-up with his GI provider as scheduled for a routine colonoscopy on Tuesday.  COVID test is pending at the time of discharge, patient will be notified of the results in 2 days.  He will continue with his previously prescribed meds and topical aids, and a Proctofoam prescription is provided.  Return precautions have been reviewed. ____________________________________________  FINAL CLINICAL IMPRESSION(S) / ED DIAGNOSES  Final diagnoses:  Hemorrhoids, unspecified hemorrhoid type      Carmie End, Dannielle Karvonen, PA-C 10/04/18 2019    Nena Polio, MD 10/08/18 2359

## 2018-10-04 NOTE — Discharge Instructions (Addendum)
Use the prescription meds as directed. Use OTC Tucks pads or Baneol for cleansing following stools. Continue to use daily stool softeners. Follow-up with Dr. Bailey Mech for ongoing symptoms. Return as needed.

## 2018-10-04 NOTE — ED Notes (Signed)

## 2018-10-04 NOTE — Telephone Encounter (Signed)
Spoke with pt and was able to schedule procedure. 

## 2018-10-05 LAB — NOVEL CORONAVIRUS, NAA (HOSP ORDER, SEND-OUT TO REF LAB; TAT 18-24 HRS): SARS-CoV-2, NAA: NOT DETECTED

## 2018-10-08 ENCOUNTER — Encounter: Payer: Self-pay | Admitting: *Deleted

## 2018-10-09 ENCOUNTER — Encounter: Payer: Self-pay | Admitting: Anesthesiology

## 2018-10-09 ENCOUNTER — Ambulatory Visit: Admission: RE | Admit: 2018-10-09 | Payer: Medicaid Other | Source: Home / Self Care | Admitting: Gastroenterology

## 2018-10-09 ENCOUNTER — Encounter: Admission: RE | Payer: Self-pay | Source: Home / Self Care

## 2018-10-09 ENCOUNTER — Telehealth: Payer: Self-pay | Admitting: Gastroenterology

## 2018-10-09 SURGERY — COLONOSCOPY WITH PROPOFOL
Anesthesia: General

## 2018-10-09 NOTE — Telephone Encounter (Signed)
Patient went to the hospital for his colonoscopy scheduled& cancelled for 10-09-2018.Please call to r/s procedure.

## 2018-10-10 NOTE — Telephone Encounter (Signed)
LVM returning patients call to schedule his colonoscopy.  Informed him that Dr. Vicente Males is available on Tue May 26th , or Tues June 2nd.  Will await for him to call back to confirm what date works best.  Scheduling Note: Abnormal CT scan, small bowel R93.3  Thanks Sharyn Lull

## 2018-10-11 ENCOUNTER — Telehealth: Payer: Self-pay | Admitting: Gastroenterology

## 2018-10-11 NOTE — Telephone Encounter (Signed)
Called pt in regards to cancelled procedure  Unable to contact, LVM to return call

## 2018-10-11 NOTE — Telephone Encounter (Signed)
Am not sure what time he went for his procedure- I was there doing my other procedures , we looked for him, the staff tried calling him multiple times, not sure if he went to the wrong building,I was waiting till 2.45 pm , then I left. Reschedule it with clear instructions, confirm telephone number- endo staff tried calling him multiple times too

## 2018-10-11 NOTE — Telephone Encounter (Signed)
Pt left vm  He is still trying to figure out why his procedure was cancelled on Tuesday without any one telling him he states he did not receive a message about this yet

## 2018-10-11 NOTE — Telephone Encounter (Signed)
Pt left vm he states  He went to have a colonoscopy on Tuesday and did his Prep and everything and went to have it done and they informed him procedure was cancelled and No one had informed him of this he is expecting a call

## 2018-10-11 NOTE — Telephone Encounter (Signed)
Called pt to reschedule procedure.  Unable to contact, LVM to return call

## 2018-10-16 NOTE — Telephone Encounter (Signed)
Called pt regarding cancelled colonoscopy procedure.  Unable to contact via phone. MyChart message has been sent.

## 2018-10-16 NOTE — Telephone Encounter (Signed)
Pt left vm he is calling to reschedule his procedure that was cancelled the day off please call pt

## 2018-10-16 NOTE — Telephone Encounter (Signed)
Pt left vm regarding prev. Messages to find out what happened on his procedure day and why it was cancelled

## 2018-10-17 ENCOUNTER — Other Ambulatory Visit: Payer: Self-pay

## 2018-10-17 DIAGNOSIS — R933 Abnormal findings on diagnostic imaging of other parts of digestive tract: Secondary | ICD-10-CM

## 2018-10-17 MED ORDER — NA SULFATE-K SULFATE-MG SULF 17.5-3.13-1.6 GM/177ML PO SOLN: 1.0000 | Freq: Once | ORAL | 0 refills | Status: AC

## 2018-10-17 NOTE — Telephone Encounter (Signed)
Spoke with pt and was able to reschedule colonoscopy procedure. Pt is aware that he'll need to contact [facility] the day before his procedure to find out his arrival time.

## 2018-10-18 ENCOUNTER — Other Ambulatory Visit: Payer: Self-pay

## 2018-10-18 ENCOUNTER — Encounter
Admission: RE | Admit: 2018-10-18 | Discharge: 2018-10-18 | Disposition: A | Discharge status: Home / Self Care | Payer: Medicaid Other | Source: Ambulatory Visit | Attending: Gastroenterology | Admitting: Gastroenterology

## 2018-10-19 ENCOUNTER — Other Ambulatory Visit
Admission: RE | Admit: 2018-10-19 | Discharge: 2018-10-19 | Disposition: A | Discharge status: Home / Self Care | Payer: Medicaid Other | Source: Ambulatory Visit | Attending: Gastroenterology | Admitting: Gastroenterology

## 2018-10-19 ENCOUNTER — Encounter: Payer: Self-pay | Admitting: *Deleted

## 2018-10-19 DIAGNOSIS — Z1159 Encounter for screening for other viral diseases: Secondary | ICD-10-CM | POA: Diagnosis present

## 2018-10-20 LAB — NOVEL CORONAVIRUS, NAA (HOSP ORDER, SEND-OUT TO REF LAB; TAT 18-24 HRS): SARS-CoV-2, NAA: NOT DETECTED

## 2018-10-23 ENCOUNTER — Encounter: Payer: Self-pay | Admitting: *Deleted

## 2018-10-23 ENCOUNTER — Encounter
Admission: RE | Disposition: A | Discharge status: Home / Self Care | Payer: Self-pay | Source: Home / Self Care | Attending: Gastroenterology

## 2018-10-23 ENCOUNTER — Ambulatory Visit
Admission: RE | Admit: 2018-10-23 | Discharge: 2018-10-23 | Disposition: A | Discharge status: Home / Self Care | Payer: Medicaid Other | Attending: Gastroenterology | Admitting: Gastroenterology

## 2018-10-23 ENCOUNTER — Telehealth: Payer: Self-pay | Admitting: Gastroenterology

## 2018-10-23 ENCOUNTER — Other Ambulatory Visit: Payer: Self-pay

## 2018-10-23 DIAGNOSIS — R933 Abnormal findings on diagnostic imaging of other parts of digestive tract: Secondary | ICD-10-CM | POA: Insufficient documentation

## 2018-10-23 DIAGNOSIS — Z539 Procedure and treatment not carried out, unspecified reason: Secondary | ICD-10-CM | POA: Insufficient documentation

## 2018-10-23 LAB — URINE DRUG SCREEN, QUALITATIVE (ARMC ONLY)
Amphetamines, Ur Screen: NOT DETECTED
Barbiturates, Ur Screen: NOT DETECTED
Benzodiazepine, Ur Scrn: NOT DETECTED
Cannabinoid 50 Ng, Ur ~~LOC~~: POSITIVE — AB
Cocaine Metabolite,Ur ~~LOC~~: POSITIVE — AB
MDMA (Ecstasy)Ur Screen: NOT DETECTED
Methadone Scn, Ur: NOT DETECTED
Opiate, Ur Screen: NOT DETECTED
Phencyclidine (PCP) Ur S: NOT DETECTED
Tricyclic, Ur Screen: NOT DETECTED

## 2018-10-23 SURGERY — COLONOSCOPY WITH PROPOFOL
Anesthesia: General

## 2018-10-23 MED ORDER — NA SULFATE-K SULFATE-MG SULF 17.5-3.13-1.6 GM/177ML PO SOLN: 1.0000 | Freq: Once | ORAL | 0 refills | Status: AC

## 2018-10-23 MED ORDER — SODIUM CHLORIDE 0.9 % IV SOLN: INTRAVENOUS | Status: DC

## 2018-10-23 MED ORDER — LACTATED RINGERS IV SOLN
INTRAVENOUS | Status: DC
  Administered 2018-10-23: 12:00:00 via INTRAVENOUS

## 2018-10-23 NOTE — Telephone Encounter (Signed)
Pt procedure has been rescheduled.

## 2018-10-23 NOTE — Telephone Encounter (Signed)
Patient called & was schedule for a colonoscopy today with Dr Vicente Males. He states there where complications& the procedure was cancelled. Please call to r/s pcolonoscopy.

## 2018-11-05 ENCOUNTER — Telehealth: Payer: Self-pay | Admitting: Gastroenterology

## 2018-11-05 NOTE — Telephone Encounter (Signed)
Pt is calling he has  A procedure tomorrow and needs to know if he was supposed to do another Covid 19 test he has had 3 in the past month and stated he has been quarantined please call pt

## 2018-11-06 ENCOUNTER — Ambulatory Visit: Admission: RE | Admit: 2018-11-06 | Payer: Medicaid Other | Source: Home / Self Care | Admitting: Gastroenterology

## 2018-11-06 ENCOUNTER — Encounter: Admission: RE | Payer: Self-pay | Source: Home / Self Care

## 2018-11-06 ENCOUNTER — Other Ambulatory Visit: Payer: Self-pay

## 2018-11-06 DIAGNOSIS — R933 Abnormal findings on diagnostic imaging of other parts of digestive tract: Secondary | ICD-10-CM

## 2018-11-06 SURGERY — COLONOSCOPY WITH PROPOFOL
Anesthesia: General

## 2018-11-06 NOTE — Telephone Encounter (Signed)
Spoke with pt yesterday and reminded him that when we rescheduled his procedure 2 weeks ago he was told that another COVID test would be required. Pt then stated that he forgot and would like to reschedule. Pt procedure has been rescheduled. Will plan to call pt with a reminder.

## 2018-11-14 ENCOUNTER — Ambulatory Visit: Payer: Medicaid Other | Admitting: Gastroenterology

## 2018-11-15 ENCOUNTER — Other Ambulatory Visit: Admission: RE | Admit: 2018-11-15 | Payer: Medicaid Other | Source: Ambulatory Visit

## 2018-11-16 ENCOUNTER — Other Ambulatory Visit
Admission: RE | Admit: 2018-11-16 | Discharge: 2018-11-16 | Disposition: A | Discharge status: Home / Self Care | Payer: Medicaid Other | Source: Ambulatory Visit | Attending: Gastroenterology | Admitting: Gastroenterology

## 2018-11-16 ENCOUNTER — Other Ambulatory Visit: Payer: Self-pay

## 2018-11-16 DIAGNOSIS — Z1159 Encounter for screening for other viral diseases: Secondary | ICD-10-CM | POA: Insufficient documentation

## 2018-11-17 LAB — NOVEL CORONAVIRUS, NAA (HOSP ORDER, SEND-OUT TO REF LAB; TAT 18-24 HRS): SARS-CoV-2, NAA: NOT DETECTED

## 2018-11-19 ENCOUNTER — Other Ambulatory Visit: Payer: Self-pay

## 2018-11-19 ENCOUNTER — Ambulatory Visit: Payer: Medicaid Other | Admitting: Anesthesiology

## 2018-11-19 ENCOUNTER — Encounter
Admission: RE | Disposition: A | Discharge status: Home / Self Care | Payer: Self-pay | Source: Home / Self Care | Attending: Gastroenterology

## 2018-11-19 ENCOUNTER — Encounter: Payer: Self-pay | Admitting: Anesthesiology

## 2018-11-19 ENCOUNTER — Ambulatory Visit
Admission: RE | Admit: 2018-11-19 | Discharge: 2018-11-19 | Disposition: A | Discharge status: Home / Self Care | Payer: Medicaid Other | Attending: Gastroenterology | Admitting: Gastroenterology

## 2018-11-19 DIAGNOSIS — E739 Lactose intolerance, unspecified: Secondary | ICD-10-CM | POA: Diagnosis not present

## 2018-11-19 DIAGNOSIS — D122 Benign neoplasm of ascending colon: Secondary | ICD-10-CM | POA: Diagnosis not present

## 2018-11-19 DIAGNOSIS — F17210 Nicotine dependence, cigarettes, uncomplicated: Secondary | ICD-10-CM | POA: Insufficient documentation

## 2018-11-19 DIAGNOSIS — Z880 Allergy status to penicillin: Secondary | ICD-10-CM | POA: Diagnosis not present

## 2018-11-19 DIAGNOSIS — Z8711 Personal history of peptic ulcer disease: Secondary | ICD-10-CM | POA: Diagnosis not present

## 2018-11-19 DIAGNOSIS — Z91018 Allergy to other foods: Secondary | ICD-10-CM | POA: Diagnosis not present

## 2018-11-19 DIAGNOSIS — R933 Abnormal findings on diagnostic imaging of other parts of digestive tract: Secondary | ICD-10-CM | POA: Diagnosis not present

## 2018-11-19 DIAGNOSIS — Z888 Allergy status to other drugs, medicaments and biological substances status: Secondary | ICD-10-CM | POA: Insufficient documentation

## 2018-11-19 HISTORY — PX: COLONOSCOPY WITH PROPOFOL: SHX5780

## 2018-11-19 LAB — URINE DRUG SCREEN, QUALITATIVE (ARMC ONLY)
Amphetamines, Ur Screen: NOT DETECTED
Barbiturates, Ur Screen: NOT DETECTED
Benzodiazepine, Ur Scrn: NOT DETECTED
Cannabinoid 50 Ng, Ur ~~LOC~~: POSITIVE — AB
Cocaine Metabolite,Ur ~~LOC~~: NOT DETECTED
MDMA (Ecstasy)Ur Screen: NOT DETECTED
Methadone Scn, Ur: NOT DETECTED
Opiate, Ur Screen: NOT DETECTED
Phencyclidine (PCP) Ur S: NOT DETECTED
Tricyclic, Ur Screen: NOT DETECTED

## 2018-11-19 SURGERY — COLONOSCOPY WITH PROPOFOL
Anesthesia: General

## 2018-11-19 MED ORDER — PROPOFOL 500 MG/50ML IV EMUL
INTRAVENOUS | Status: AC
  Filled 2018-11-19: qty 50

## 2018-11-19 MED ORDER — LACTATED RINGERS IV SOLN
INTRAVENOUS | Status: DC
  Administered 2018-11-19: 11:00:00 via INTRAVENOUS

## 2018-11-19 MED ORDER — SODIUM CHLORIDE 0.9 % IV SOLN: INTRAVENOUS | Status: DC

## 2018-11-19 MED ORDER — PROPOFOL 10 MG/ML IV BOLUS
INTRAVENOUS | Status: DC | PRN
  Administered 2018-11-19: 100 mg via INTRAVENOUS

## 2018-11-19 MED ORDER — PROPOFOL 500 MG/50ML IV EMUL
INTRAVENOUS | Status: DC | PRN
  Administered 2018-11-19: 150 ug/kg/min via INTRAVENOUS

## 2018-11-19 NOTE — Anesthesia Postprocedure Evaluation (Signed)
Anesthesia Post Note  Patient: Wesley Hart  Procedure(s) Performed: COLONOSCOPY WITH PROPOFOL (N/A )  Patient location during evaluation: Endoscopy Anesthesia Type: General Level of consciousness: awake and alert Pain management: pain level controlled Vital Signs Assessment: post-procedure vital signs reviewed and stable Respiratory status: spontaneous breathing, nonlabored ventilation, respiratory function stable and patient connected to nasal cannula oxygen Cardiovascular status: blood pressure returned to baseline and stable Postop Assessment: no apparent nausea or vomiting Anesthetic complications: no     Last Vitals:  Vitals:   11/19/18 1204 11/19/18 1214  BP: (!) 129/95 (!) 141/93  Pulse: 62 60  Resp: 13 12  Temp:    SpO2: 100% 100%    Last Pain:  Vitals:   11/19/18 1214  TempSrc:   PainSc: 0-No pain                 Kura Bethards S

## 2018-11-19 NOTE — Op Note (Signed)
[facility] Patient Name: Wesley Hart Procedure Date: 11/19/2018 11:24 AM MRN: [id number] Account #: [id number] Date of Birth: 07-13-1985 Admit Type: Outpatient Age: 33 Room: [facility] ENDO ROOM 4 Gender: Male Note Status: Finalized Procedure:            Colonoscopy Indications:          Abnormal CT of the GI tract Providers:            Jonathon Bellows MD, MD Medicines:            Monitored Anesthesia Care Complications:        No immediate complications. Procedure:            Pre-Anesthesia Assessment:                       - Prior to the procedure, a History and Physical was                        performed, and patient medications, allergies and                        sensitivities were reviewed. The patient's tolerance of                        previous anesthesia was reviewed.                       - The risks and benefits of the procedure and the                        sedation options and risks were discussed with the                        patient. All questions were answered and informed                        consent was obtained.                       - ASA Grade Assessment: II - A patient with mild                        systemic disease.                       After obtaining informed consent, the colonoscope was                        passed under direct vision. Throughout the procedure,                        the patient's blood pressure, pulse, and oxygen                        saturations were monitored continuously. The                        Colonoscope was introduced through the anus and                        advanced to the the terminal ileum. The colonoscopy was  performed with ease. The patient tolerated the                        procedure well. The quality of the bowel preparation                        was good. Findings:      The perianal and digital rectal examinations were normal.      The terminal  ileum appeared normal. Biopsies were taken with a cold       forceps for histology.      A 5 mm polyp was found in the proximal ascending colon. The polyp was       sessile. The polyp was removed with a cold snare. Resection and       retrieval were complete.      The exam was otherwise without abnormality on direct and retroflexion       views. Impression:           - The examined portion of the ileum was normal.                        Biopsied.                       - One 5 mm polyp in the proximal ascending colon,                        removed with a cold snare. Resected and retrieved.                       - The examination was otherwise normal on direct and                        retroflexion views. Recommendation:       - Discharge patient to home (with escort).                       - Resume previous diet.                       - Continue present medications.                       - Await pathology results.                       - Repeat colonoscopy for surveillance based on                        pathology results.                       - Return to GI office as previously scheduled. Procedure Code(s):    --- Professional ---                       [phone], Colonoscopy, flexible; with removal of tumor(s),                        polyp(s), or other lesion(s) by snare technique                       39767, 64,  Colonoscopy, flexible; with biopsy, single                        or multiple Diagnosis Code(s):    --- Professional ---                       K63.5, Polyp of colon                       R93.3, Abnormal findings on diagnostic imaging of other                        parts of digestive tract CPT copyright 2019 American Medical Association. All rights reserved. The codes documented in this report are preliminary and upon coder review may  be revised to meet current compliance requirements. Jonathon Bellows, MD Jonathon Bellows MD, MD 11/19/2018 11:50:15 AM This report has been signed  electronically. Number of Addenda: 0 Note Initiated On: 11/19/2018 11:24 AM Scope Withdrawal Time: 0 hours 10 minutes 39 seconds  Total Procedure Duration: 0 hours 13 minutes 35 seconds  Estimated Blood Loss: Estimated blood loss: none.      [facility]

## 2018-11-19 NOTE — H&P (Signed)
Jonathon Bellows, MD [address] [place], [place], [place], [address] [address], [place], [place], [place], [address] Phone: [phone]  Fax: [phone]  Primary Care Physician:  Patient, No Pcp Per   Pre-Procedure History & Physical: HPI:  KABIR BRANNOCK is a 33 y.o. male is here for an colonoscopy.   Past Medical History:  Diagnosis Date  . Lactose intolerance   . Stomach ulcer     Past Surgical History:  Procedure Laterality Date  . FRACTURE SURGERY  2005  . KNEE ARTHROSCOPY Left 09/22/2016   Procedure: ARTHROSCOPY KNEE;  Surgeon: Corky Mull, MD;  Location: [facility] ORS;  Service: Orthopedics;  Laterality: Left;  . ORIF TIBIA PLATEAU Left 09/22/2016   Procedure: OPEN REDUCTION INTERNAL FIXATION (ORIF) TIBIAL PLATEAU;  Surgeon: Corky Mull, MD;  Location: [facility] ORS;  Service: Orthopedics;  Laterality: Left;    Prior to Admission medications   Medication Sig Start Date End Date Taking? Authorizing Provider  dibucaine (NUPERCAINAL) 1 % OINT Place 1 application rectally as needed for hemorrhoids. 10/02/18  Yes Laban Emperor, PA-C  dicyclomine (BENTYL) 20 MG tablet Take 1 tablet (20 mg total) by mouth 3 (three) times daily as needed for spasms. 09/26/18 09/26/19 Yes Dustin Flock, MD  hydrocortisone (ANUSOL-HC) 2.5 % rectal cream Place 1 application rectally 2 (two) times daily. 10/02/18  Yes Laban Emperor, PA-C  pantoprazole (PROTONIX) 40 MG tablet Take 1 tablet (40 mg total) by mouth daily. 09/26/18 11/25/18 Yes Dustin Flock, MD  traMADol (ULTRAM) 50 MG tablet Take 1 tablet (50 mg total) by mouth every 12 (twelve) hours as needed. 10/02/18 10/02/19 Yes Laban Emperor, PA-C    Allergies as of 11/06/2018 - Review Complete 10/23/2018  Allergen Reaction Noted  . Mushroom extract complex Anaphylaxis 09/19/2016  . Lactose intolerance (gi) Nausea And Vomiting 10/13/2014  . Normal saline [sodium chloride] Other (See Comments) 05/15/2018  . Penicillins Nausea And Vomiting  09/19/2016  . Prednisone Itching and Other (See Comments) 12/06/2014    Family History  Problem Relation Age of Onset  . Hypertension Mother     Social History   Socioeconomic History  . Marital status: Divorced    Spouse name: Not on file  . Number of children: Not on file  . Years of education: Not on file  . Highest education level: Not on file  Occupational History  . Not on file  Social Needs  . Financial resource strain: Not on file  . Food insecurity    Worry: Not on file    Inability: Not on file  . Transportation needs    Medical: Not on file    Non-medical: Not on file  Tobacco Use  . Smoking status: Current Every Day Smoker    Packs/day: 0.25    Years: 18.00    Pack years: 4.50    Types: Cigarettes  . Smokeless tobacco: Never Used  Substance and Sexual Activity  . Alcohol use: Yes    Comment: 1-2 BEERS weekly  . Drug use: Yes    Types: Marijuana    Comment: PT DENIES THIS DURING PHONE INTERVIEW (09-20-16)  . Sexual activity: Yes  Lifestyle  . Physical activity    Days per week: Not on file    Minutes per session: Not on file  . Stress: Not on file  Relationships  . Social Herbalist on phone: Not on file    Gets together: Not on file    Attends religious  service: Not on file    Active member of club or organization: Not on file    Attends meetings of clubs or organizations: Not on file    Relationship status: Not on file  . Intimate partner violence    Fear of current or ex partner: Not on file    Emotionally abused: Not on file    Physically abused: Not on file    Forced sexual activity: Not on file  Other Topics Concern  . Not on file  Social History Narrative  . Not on file    Review of Systems: See HPI, otherwise negative ROS  Physical Exam: BP (!) 155/91   Pulse 86   Temp (!) 96.9 F (36.1 C) (Tympanic)   Resp 16   Ht 5\' 11"  (1.803 m)   Wt 106.6 kg   BMI 32.78 kg/m  General:   Alert,  pleasant and cooperative in NAD  Head:  Normocephalic and atraumatic. Neck:  Supple; no masses or thyromegaly. Lungs:  Clear throughout to auscultation, normal respiratory effort.    Heart:  +S1, +S2, Regular rate and rhythm, No edema. Abdomen:  Soft, nontender and nondistended. Normal bowel sounds, without guarding, and without rebound.   Neurologic:  Alert and  oriented x4;  grossly normal neurologically.  Impression/Plan: HASHEM GOYNES is here for an colonoscopy to be performed for abnormal CT scan . Risks, benefits, limitations, and alternatives regarding  colonoscopy have been reviewed with the patient.  Questions have been answered.  All parties agreeable.   Jonathon Bellows, MD  11/19/2018, 11:21 AM

## 2018-11-19 NOTE — Anesthesia Post-op Follow-up Note (Signed)
Anesthesia QCDR form completed.        

## 2018-11-19 NOTE — Transfer of Care (Signed)
Immediate Anesthesia Transfer of Care Note  Patient: Wesley Hart  Procedure(s) Performed: COLONOSCOPY WITH PROPOFOL (N/A )  Patient Location: PACU  Anesthesia Type:General  Level of Consciousness: awake, alert  and oriented  Airway & Oxygen Therapy: Patient Spontanous Breathing and Patient connected to nasal cannula oxygen  Post-op Assessment: Report given to RN and Post -op Vital signs reviewed and stable  Post vital signs: Reviewed and stable  Last Vitals:  Vitals Value Taken Time  BP    Temp    Pulse    Resp    SpO2      Last Pain:  Vitals:   11/19/18 1004  TempSrc: Tympanic  PainSc: 0-No pain         Complications: No apparent anesthesia complications

## 2018-11-19 NOTE — Anesthesia Preprocedure Evaluation (Signed)
Anesthesia Evaluation  Patient identified by MRN, date of birth, ID band Patient awake    Reviewed: Allergy & Precautions, NPO status , Patient's Chart, lab work & pertinent test results, reviewed documented beta blocker date and time   Airway Mallampati: II  TM Distance: >3 FB     Dental  (+) Chipped   Pulmonary Current Smoker,           Cardiovascular      Neuro/Psych    GI/Hepatic PUD,   Endo/Other    Renal/GU      Musculoskeletal   Abdominal   Peds  Hematology   Anesthesia Other Findings   Reproductive/Obstetrics                             Anesthesia Physical Anesthesia Plan  ASA: III  Anesthesia Plan: General   Post-op Pain Management:    Induction: Intravenous  PONV Risk Score and Plan:   Airway Management Planned:   Additional Equipment:   Intra-op Plan:   Post-operative Plan:   Informed Consent: I have reviewed the patients History and Physical, chart, labs and discussed the procedure including the risks, benefits and alternatives for the proposed anesthesia with the patient or authorized representative who has indicated his/her understanding and acceptance.       Plan Discussed with: CRNA  Anesthesia Plan Comments:         Anesthesia Quick Evaluation

## 2018-11-20 ENCOUNTER — Encounter: Payer: Self-pay | Admitting: Gastroenterology

## 2018-11-20 LAB — SURGICAL PATHOLOGY

## 2018-11-26 ENCOUNTER — Ambulatory Visit (INDEPENDENT_AMBULATORY_CARE_PROVIDER_SITE_OTHER): Payer: Medicaid Other | Admitting: Gastroenterology

## 2018-11-26 DIAGNOSIS — Z86010 Personal history of colonic polyps: Secondary | ICD-10-CM

## 2018-11-26 DIAGNOSIS — R933 Abnormal findings on diagnostic imaging of other parts of digestive tract: Secondary | ICD-10-CM

## 2018-11-26 NOTE — Progress Notes (Signed)
Wesley Hart , MD [address]  [place]  [place],  [address]  Main: [phone]  Fax: [phone]   Primary Care Physician: Patient, No Pcp Per  Virtual Visit via Video Note  I connected with patient on 11/26/18 at  1:15 PM EDT by video and verified that I am speaking with the correct person using two identifiers.   I discussed the limitations, risks, security and privacy concerns of performing an evaluation and management service by video  and the availability of in person appointments. I also discussed with the patient that there may be a patient responsible charge related to this service. The patient expressed understanding and agreed to proceed.  Location of Patient: Home Location of Provider: Home Persons involved: Patient and provider only   History of Present Illness: Follow up for abnormal CT scan   HPI: Wesley Hart is a 33 y.o. male  Summary of history :  He is here today to follow up to his recent hospital discharge on 09/26/2018 when he was admitted and seen by myself for abdominal pain . The pain began the day prior acutely,didn't get better , hence came into the hospital. Generalized, non radiating .Similar presentation in 04/2018 at the ER -attributed to issues with lactose intolerance . CT scan demonstrated enteritis - was suggested outpatient GI follow up but didn't do so .  On this presentation he also underwent a CT scan of the abdomen which showed features of enteritis- particularly affecting the distal terminal ileum,similr in presentation to scan in 04/2018.  He has had a few ER visits last on 5/122020 for constipation and hemorrhoids. He takes percocet's, did not have a bowel movement and went to ER. X ray abdomen showed moderate stool . Was given cream for hemorroids and D.c  I intended to perform a colonoscopy with TI biopsies but since his urine showed cocaine he was discharged with a course of Ciproflixacin/Flagyl and asked to  follow up   Interval history  10/04/2018-11/26/2018  11/19/2018:  Colonoscopy : normal TI bx, 1 sessile serrated adenoma resected   Feels well no significant abdominal pain.  Eating and drinking well.  Current Outpatient Medications  Medication Sig Dispense Refill  . dibucaine (NUPERCAINAL) 1 % OINT Place 1 application rectally as needed for hemorrhoids. 28 g 0  . dicyclomine (BENTYL) 20 MG tablet Take 1 tablet (20 mg total) by mouth 3 (three) times daily as needed for spasms. 30 tablet 0  . hydrocortisone (ANUSOL-HC) 2.5 % rectal cream Place 1 application rectally 2 (two) times daily. 30 g 0  . pantoprazole (PROTONIX) 40 MG tablet Take 1 tablet (40 mg total) by mouth daily. 30 tablet 1  . traMADol (ULTRAM) 50 MG tablet Take 1 tablet (50 mg total) by mouth every 12 (twelve) hours as needed. 6 tablet 0   No current facility-administered medications for this visit.     Allergies as of 11/26/2018 - Review Complete 11/19/2018  Allergen Reaction Noted  . Mushroom extract complex Anaphylaxis 09/19/2016  . Lactose intolerance (gi) Nausea And Vomiting 10/13/2014  . Normal saline [sodium chloride] Other (See Comments) 05/15/2018  . Penicillins Nausea And Vomiting 09/19/2016  . Prednisone Itching and Other (See Comments) 12/06/2014    Review of Systems:    All systems reviewed and negative except where noted in HPI.  General Appearance:    Alert, cooperative, no distress, appears stated age  Head:    Normocephalic, without obvious abnormality, atraumatic  Eyes:  PERRL, conjunctiva/corneas clear,  Ears:    Grossly normal hearing    Neurologic:  Grossly normal    Observations/Objective:  Labs: CMP     Component Value Date/Time   NA 136 09/25/2018 0259   NA 140 09/07/2014 1544   K 3.5 09/25/2018 0259   K 3.9 09/07/2014 1544   CL 105 09/25/2018 0259   CL 109 09/07/2014 1544   CO2 24 09/25/2018 0259   CO2 26 09/07/2014 1544   GLUCOSE 92 09/25/2018 0259   GLUCOSE 92 09/07/2014  1544   BUN 7 09/25/2018 0259   BUN 7 09/07/2014 1544   CREATININE 0.99 09/25/2018 0259   CREATININE 1.01 09/07/2014 1544   CALCIUM 8.2 (L) 09/25/2018 0259   CALCIUM 8.7 (L) 09/07/2014 1544   PROT 7.0 09/24/2018 0749   PROT 6.3 (L) 09/07/2014 1544   ALBUMIN 4.0 09/24/2018 0749   ALBUMIN 3.9 09/07/2014 1544   AST 23 09/24/2018 0749   AST 22 09/07/2014 1544   ALT 27 09/24/2018 0749   ALT 32 09/07/2014 1544   ALKPHOS 47 09/24/2018 0749   ALKPHOS 50 09/07/2014 1544   BILITOT 0.6 09/24/2018 0749   BILITOT <0.1 (L) 09/07/2014 1544   GFRNONAA >60 09/25/2018 0259   GFRNONAA >60 09/07/2014 1544   GFRAA >60 09/25/2018 0259   GFRAA >60 09/07/2014 1544   Lab Results  Component Value Date   WBC 6.2 09/25/2018   HGB 13.5 09/25/2018   HCT 40.8 09/25/2018   MCV 88.7 09/25/2018   PLT 230 09/25/2018    Imaging Studies: No results found.  Assessment and Plan:   Wesley Hart is a 33 y.o. y/o male  here to follow up for  abdominal pain-recent hospitalization and discharge on 09/26/2018 , imaging suggestive of enteritis.normal colonoscopy with TI biopsies with no evidence of Crohns disease. 1 sessile serrated adenoma noted   Plan  1.Continue use of laxatives for constipation. 2. Colonoscopy in 5 years      I discussed the assessment and treatment plan with the patient. The patient was provided an opportunity to ask questions and all were answered. The patient agreed with the plan and demonstrated an understanding of the instructions.   The patient was advised to call back or seek an in-person evaluation if the symptoms worsen or if the condition fails to improve as anticipated.    Dr Wesley Bellows MD,MRCP [facility]) Gastroenterology/Hepatology Pager: [phone]   Speech recognition software was used to dictate this note.

## 2024-05-26 ENCOUNTER — Emergency Department: Payer: Self-pay

## 2024-05-26 ENCOUNTER — Emergency Department
Admission: EM | Admit: 2024-05-26 | Discharge: 2024-05-26 | Disposition: A | Discharge status: Home / Self Care | Payer: Self-pay

## 2024-05-26 DIAGNOSIS — N132 Hydronephrosis with renal and ureteral calculous obstruction: Secondary | ICD-10-CM | POA: Insufficient documentation

## 2024-05-26 DIAGNOSIS — N200 Calculus of kidney: Secondary | ICD-10-CM

## 2024-05-26 LAB — URINALYSIS, ROUTINE W REFLEX MICROSCOPIC
Bilirubin Urine: NEGATIVE
Glucose, UA: NEGATIVE mg/dL
Ketones, ur: NEGATIVE mg/dL
Leukocytes,Ua: NEGATIVE
Nitrite: NEGATIVE
Protein, ur: 30 mg/dL — AB
RBC / HPF: >50 RBC/hpf (ref 0–5)
Specific Gravity, Urine: 1.018 (ref 1.005–1.030)
Squamous Epithelial / HPF: 0 /HPF (ref 0–5)
pH: 5 (ref 5.0–8.0)

## 2024-05-26 LAB — CBC
HCT: 46.3 % (ref 39.0–52.0)
Hemoglobin: 15.6 g/dL (ref 13.0–17.0)
MCH: 28.6 pg (ref 26.0–34.0)
MCHC: 33.7 g/dL (ref 30.0–36.0)
MCV: 84.8 fL (ref 80.0–100.0)
Platelets: 239 K/uL (ref 150–400)
RBC: 5.46 MIL/uL (ref 4.22–5.81)
RDW: 14.7 % (ref 11.5–15.5)
WBC: 6.7 K/uL (ref 4.0–10.5)
nRBC: 0 % (ref 0.0–0.2)

## 2024-05-26 LAB — BASIC METABOLIC PANEL WITH GFR
Anion gap: 14 (ref 5–15)
BUN: 7 mg/dL (ref 6–20)
CO2: 22 mmol/L (ref 22–32)
Calcium: 9.2 mg/dL (ref 8.9–10.3)
Chloride: 105 mmol/L (ref 98–111)
Creatinine, Ser: 0.9 mg/dL (ref 0.61–1.24)
GFR, Estimated: >60 mL/min
Glucose, Bld: 94 mg/dL (ref 70–99)
Potassium: 4 mmol/L (ref 3.5–5.1)
Sodium: 141 mmol/L (ref 135–145)

## 2024-05-26 MED ORDER — HYDROMORPHONE HCL 1 MG/ML IJ SOLN
0.5000 mg | Freq: Once | INTRAMUSCULAR | Status: AC
  Administered 2024-05-26: 0.5 mg via INTRAVENOUS
  Filled 2024-05-26: qty 0.5

## 2024-05-26 MED ORDER — KETOROLAC TROMETHAMINE 15 MG/ML IJ SOLN
15.0000 mg | Freq: Once | INTRAMUSCULAR | Status: AC
  Administered 2024-05-26: 15 mg via INTRAVENOUS
  Filled 2024-05-26: qty 1

## 2024-05-26 MED ORDER — LACTATED RINGERS IV BOLUS: 500.0000 mL | Freq: Once | INTRAVENOUS | Status: DC

## 2024-05-26 MED ORDER — LACTATED RINGERS IV BOLUS
1000.0000 mL | Freq: Once | INTRAVENOUS | Status: AC
  Administered 2024-05-26: 1000 mL via INTRAVENOUS

## 2024-05-26 MED ORDER — TAMSULOSIN HCL 0.4 MG PO CAPS
0.4000 mg | ORAL_CAPSULE | Freq: Once | ORAL | Status: AC
  Administered 2024-05-26: 0.4 mg via ORAL
  Filled 2024-05-26: qty 1

## 2024-05-26 MED ORDER — TAMSULOSIN HCL 0.4 MG PO CAPS: 0.4000 mg | ORAL_CAPSULE | Freq: Every day | ORAL | 0 refills | Status: AC

## 2024-05-26 MED ORDER — OXYCODONE-ACETAMINOPHEN 5-325 MG PO TABS: 1.0000 | ORAL_TABLET | ORAL | 0 refills | Status: AC | PRN

## 2024-05-26 MED ORDER — ONDANSETRON 4 MG PO TBDP: 4.0000 mg | ORAL_TABLET | Freq: Three times a day (TID) | ORAL | 0 refills | Status: AC | PRN

## 2024-05-26 NOTE — ED Triage Notes (Signed)
 Pt. Reports urinating blood and right flank pain x 4 hours, endorses N/V and chills

## 2024-05-26 NOTE — Discharge Instructions (Signed)

## 2024-05-26 NOTE — ED Provider Notes (Signed)
 "  [facility] Provider Note    Event Date/Time   First MD Initiated Contact with Patient 05/26/24 [phone]     (approximate)   History   Hematuria and Flank Pain   HPI  Wesley Hart is a 39 y.o. male with no significant past medical history who presents with hematuria and right flank pain for 4 hours.  He reports associated nausea and vomiting and states he has chills.  He has no prior history of kidney stones.  He denies any swelling or erythema to his penis or scrotum.  Denies any left-sided pain.     Physical Exam   Triage Vital Signs: ED Triage Vitals  Encounter Vitals Group     BP 05/26/24 0009 (!) 157/105     Girls Systolic BP Percentile --      Girls Diastolic BP Percentile --      Boys Systolic BP Percentile --      Boys Diastolic BP Percentile --      Pulse Rate 05/26/24 0009 90     Resp 05/26/24 0009 20     Temp 05/26/24 0009 97.8 F (36.6 C)     Temp Source 05/26/24 0009 Oral     SpO2 05/26/24 0009 96 %     Weight 05/26/24 0010 243 lb (110.2 kg)     Height 05/26/24 0010 6' (1.829 m)     Head Circumference --      Peak Flow --      Pain Score 05/26/24 0009 10     Pain Loc --      Pain Education --      Exclude from Growth Chart --     Most recent vital signs: Vitals:   05/26/24 0009 05/26/24 0500  BP: (!) 157/105 (!) 151/86  Pulse: 90 80  Resp: 20 16  Temp: 97.8 F (36.6 C) 98 F (36.7 C)  SpO2: 96% 97%    Nursing Triage Note reviewed. Vital signs reviewed and patients oxygen saturation is normoxic  General: Patient is well nourished, well developed, awake and alert, appears uncomfortable Head: Normocephalic and atraumatic Eyes: Normal inspection, extraocular muscles intact, no conjunctival pallor Ear, nose, throat: Normal external exam Neck: Normal range of motion Respiratory: Patient is in no respiratory distress, lungs CTAB Cardiovascular: Patient is not tachycardic, RRR without murmur appreciated GI: Abd SNT with  no guarding or rebound  Patient does have right CVA tenderness and no left-sided tenderness Back: Normal inspection of the back with good strength and range of motion throughout all ext Extremities: pulses intact with good cap refills, no LE pitting edema or calf tenderness Neuro: The patient is alert and oriented to person, place, and time, appropriately conversive, with 5/5 bilat UE/LE strength, no gross motor or sensory defects noted. Coordination appears to be adequate. Skin: Warm, dry, and intact Psych: normal mood and affect, no SI or HI  ED Results / Procedures / Treatments   Labs (all labs ordered are listed, but only abnormal results are displayed) Labs Reviewed  URINALYSIS, ROUTINE W REFLEX MICROSCOPIC - Abnormal; Notable for the following components:      Result Value   Color, Urine YELLOW (*)    APPearance CLOUDY (*)    Hgb urine dipstick LARGE (*)    Protein, ur 30 (*)    Bacteria, UA RARE (*)    All other components within normal limits  BASIC METABOLIC PANEL WITH GFR  CBC     EKG None  RADIOLOGY CT  renal stone: Right kidney stone on my independent review interpretation radiologist agrees and no steroids mild hydronephrosis    PROCEDURES:  Critical Care performed: No  Procedures   MEDICATIONS ORDERED IN ED: Medications  lactated ringers  bolus 500 mL (500 mLs Intravenous Not Given 05/26/24 0512)  lactated ringers  bolus 1,000 mL (0 mLs Intravenous Stopped 05/26/24 0512)  ketorolac  (TORADOL ) 15 MG/ML injection 15 mg (15 mg Intravenous Given 05/26/24 0119)  tamsulosin  (FLOMAX ) capsule 0.4 mg (0.4 mg Oral Given 05/26/24 0123)  HYDROmorphone  (DILAUDID ) injection 0.5 mg (0.5 mg Intravenous Given 05/26/24 0117)     IMPRESSION / MDM / ASSESSMENT AND PLAN / ED COURSE                                Differential diagnosis includes, but is not limited to, kidney stone, pyelonephritis, electrolyte derangement anemia, acute renal insufficiency   ED course: Patient  presents with acute pain of the right side.  Given the hematuria and right flank pain my suspicion for kidney stone.  An IV was inserted and he was given 1.5 L of IV fluid.  He was given Dilaudid , ketorolac  and Flomax .  CBC demonstrated no leukocytosis or acute anemia.  He had no profound electrolyte derangements and his creatinine was not elevated at 0.90.  Urinalysis was not consistent with UTI.  CT renal stone did demonstrate a kidney stone in the mid ureter.  However patient had improvement after medications and repeat exam was benign.  He feels comfortable returning home.  I did send him with prescription for 14 days of Flomax , Percocet and Zofran .  Since he does not have a primary care physician I placed a referral to primary care  as well   Clinical Course as of 05/26/24 0710  Sun May 26, 2024  0100 Creatinine: 0.90 No AKI [HD]  0301 Urinalysis, Routine w reflex microscopic -Urine, Clean Catch(!) Not consistent with UTI did she need more nausea medication [HD]    Clinical Course User Index [HD] Nicholaus Rolland BRAVO, MD   At time of discharge there is no evidence of acute life, limb, vision, or fertility threat. Patient has stable vital signs, pain is well controlled, patient is ambulatory and p.o. tolerant.  Discharge instructions were completed using the EPIC system. I would refer you to those at this time. All warnings prescriptions follow-up etc. were discussed in detail with the patient. Patient indicates understanding and is agreeable with this plan. All questions answered.  Patient is made aware that they may return to the emergency department for any worsening or new condition or for any other emergency.   -- Risk: 5 This patient has a high risk of morbidity due to further diagnostic testing or treatment. Rationale: This patients evaluation and management involve a high risk of morbidity due to the potential severity of presenting symptoms, need for diagnostic testing, and/or  initiation of treatment that may require close monitoring. The differential includes conditions with potential for significant deterioration or requiring escalation of care. Treatment decisions in the ED, including medication administration, procedural interventions, or disposition planning, reflect this level of risk. COPA: 5 The patient has the following acute or chronic illness/injury that poses a possible threat to life or bodily function: [X] : The patient has a potentially serious acute condition or an acute exacerbation of a chronic illness requiring urgent evaluation and management in the Emergency Department. The clinical presentation necessitates immediate consideration of life-threatening or function-threatening  diagnoses, even if they are ultimately ruled out.   FINAL CLINICAL IMPRESSION(S) / ED DIAGNOSES   Final diagnoses:  Nephrolithiasis     Rx / DC Orders   ED Discharge Orders          Ordered    oxyCODONE -acetaminophen  (PERCOCET) 5-325 MG tablet  Every 4 hours PRN        05/26/24 0318    ondansetron  (ZOFRAN -ODT) 4 MG disintegrating tablet  Every 8 hours PRN        05/26/24 0318    tamsulosin  (FLOMAX ) 0.4 MG CAPS capsule  Daily        05/26/24 0318    Ambulatory Referral to Primary Care (Establish Care)        05/26/24 0318             Note:  This document was prepared using Dragon voice recognition software and may include unintentional dictation errors.   Nicholaus Rolland BRAVO, MD 05/26/24 [phone]  "
